# Patient Record
Sex: Female | Born: 1935 | Race: Black or African American | Hispanic: No | Smoking: Never smoker
Health system: Southern US, Community
[De-identification: ages and names within clinical notes are randomized; demographics above are authoritative.]

## PROBLEM LIST (undated history)

## (undated) DIAGNOSIS — I2510 Atherosclerotic heart disease of native coronary artery without angina pectoris: Secondary | ICD-10-CM

## (undated) DIAGNOSIS — M1990 Unspecified osteoarthritis, unspecified site: Secondary | ICD-10-CM

## (undated) DIAGNOSIS — N189 Chronic kidney disease, unspecified: Secondary | ICD-10-CM

## (undated) DIAGNOSIS — E7800 Pure hypercholesterolemia, unspecified: Secondary | ICD-10-CM

## (undated) DIAGNOSIS — I4891 Unspecified atrial fibrillation: Secondary | ICD-10-CM

## (undated) DIAGNOSIS — I509 Heart failure, unspecified: Secondary | ICD-10-CM

## (undated) DIAGNOSIS — L8990 Pressure ulcer of unspecified site, unspecified stage: Secondary | ICD-10-CM

## (undated) DIAGNOSIS — I10 Essential (primary) hypertension: Secondary | ICD-10-CM

## (undated) DIAGNOSIS — M609 Myositis, unspecified: Secondary | ICD-10-CM

## (undated) DIAGNOSIS — N289 Disorder of kidney and ureter, unspecified: Secondary | ICD-10-CM

## (undated) DIAGNOSIS — M869 Osteomyelitis, unspecified: Secondary | ICD-10-CM

## (undated) HISTORY — PX: HERNIA REPAIR: SHX51

---

## 2013-11-11 DIAGNOSIS — I2510 Atherosclerotic heart disease of native coronary artery without angina pectoris: Secondary | ICD-10-CM

## 2013-11-11 HISTORY — DX: Atherosclerotic heart disease of native coronary artery without angina pectoris: I25.10

## 2014-08-27 ENCOUNTER — Inpatient Hospital Stay (HOSPITAL_COMMUNITY)
Admission: EM | Admit: 2014-08-27 | Discharge: 2014-08-29 | DRG: 193 | Disposition: A | Discharge status: Home / Self Care | Payer: Medicare Other | Attending: Internal Medicine | Admitting: Internal Medicine

## 2014-08-27 ENCOUNTER — Encounter (HOSPITAL_COMMUNITY): Payer: Self-pay | Admitting: *Deleted

## 2014-08-27 ENCOUNTER — Emergency Department (HOSPITAL_COMMUNITY): Payer: Medicare Other

## 2014-08-27 DIAGNOSIS — A481 Legionnaires' disease: Secondary | ICD-10-CM | POA: Diagnosis present

## 2014-08-27 DIAGNOSIS — N184 Chronic kidney disease, stage 4 (severe): Secondary | ICD-10-CM | POA: Diagnosis present

## 2014-08-27 DIAGNOSIS — E860 Dehydration: Secondary | ICD-10-CM | POA: Diagnosis present

## 2014-08-27 DIAGNOSIS — I129 Hypertensive chronic kidney disease with stage 1 through stage 4 chronic kidney disease, or unspecified chronic kidney disease: Secondary | ICD-10-CM | POA: Diagnosis present

## 2014-08-27 DIAGNOSIS — J189 Pneumonia, unspecified organism: Principal | ICD-10-CM | POA: Diagnosis present

## 2014-08-27 DIAGNOSIS — G9340 Encephalopathy, unspecified: Secondary | ICD-10-CM | POA: Diagnosis present

## 2014-08-27 DIAGNOSIS — I10 Essential (primary) hypertension: Secondary | ICD-10-CM | POA: Diagnosis present

## 2014-08-27 DIAGNOSIS — Z66 Do not resuscitate: Secondary | ICD-10-CM | POA: Diagnosis present

## 2014-08-27 DIAGNOSIS — E780 Pure hypercholesterolemia: Secondary | ICD-10-CM | POA: Diagnosis present

## 2014-08-27 DIAGNOSIS — I509 Heart failure, unspecified: Secondary | ICD-10-CM | POA: Diagnosis present

## 2014-08-27 DIAGNOSIS — J9601 Acute respiratory failure with hypoxia: Secondary | ICD-10-CM | POA: Diagnosis present

## 2014-08-27 HISTORY — DX: Heart failure, unspecified: I50.9

## 2014-08-27 HISTORY — DX: Essential (primary) hypertension: I10

## 2014-08-27 HISTORY — DX: Disorder of kidney and ureter, unspecified: N28.9

## 2014-08-27 HISTORY — DX: Pure hypercholesterolemia, unspecified: E78.00

## 2014-08-27 LAB — CBC
HCT: 39.9 % (ref 36.0–46.0)
HEMOGLOBIN: 13.3 g/dL (ref 12.0–15.0)
MCH: 29.6 pg (ref 26.0–34.0)
MCHC: 33.3 g/dL (ref 30.0–36.0)
MCV: 88.7 fL (ref 78.0–100.0)
Platelets: 276 10*3/uL (ref 150–400)
RBC: 4.5 MIL/uL (ref 3.87–5.11)
RDW: 13.1 % (ref 11.5–15.5)
WBC: 8.3 10*3/uL (ref 4.0–10.5)

## 2014-08-27 LAB — CBG MONITORING, ED: GLUCOSE-CAPILLARY: 137 mg/dL — AB (ref 70–99)

## 2014-08-27 LAB — COMPREHENSIVE METABOLIC PANEL
ALT: 11 U/L (ref 0–35)
ANION GAP: 15 (ref 5–15)
AST: 23 U/L (ref 0–37)
Albumin: 3.2 g/dL — ABNORMAL LOW (ref 3.5–5.2)
Alkaline Phosphatase: 72 U/L (ref 39–117)
BUN: 11 mg/dL (ref 6–23)
CO2: 24 mEq/L (ref 19–32)
Calcium: 10.3 mg/dL (ref 8.4–10.5)
Chloride: 97 mEq/L (ref 96–112)
Creatinine, Ser: 1.51 mg/dL — ABNORMAL HIGH (ref 0.50–1.10)
GFR calc non Af Amer: 28 mL/min — ABNORMAL LOW (ref >90)
GFR, EST AFRICAN AMERICAN: 32 mL/min — AB (ref >90)
GLUCOSE: 151 mg/dL — AB (ref 70–99)
Potassium: 4 mEq/L (ref 3.7–5.3)
SODIUM: 136 meq/L — AB (ref 137–147)
TOTAL PROTEIN: 8.2 g/dL (ref 6.0–8.3)
Total Bilirubin: 0.9 mg/dL (ref 0.3–1.2)

## 2014-08-27 LAB — URINALYSIS, ROUTINE W REFLEX MICROSCOPIC
Bilirubin Urine: NEGATIVE
Glucose, UA: NEGATIVE mg/dL
Ketones, ur: NEGATIVE mg/dL
Leukocytes, UA: NEGATIVE
NITRITE: NEGATIVE
Protein, ur: 100 mg/dL — AB
SPECIFIC GRAVITY, URINE: 1.025 (ref 1.005–1.030)
UROBILINOGEN UA: 1 mg/dL (ref 0.0–1.0)
pH: 6 (ref 5.0–8.0)

## 2014-08-27 LAB — URINE MICROSCOPIC-ADD ON

## 2014-08-27 MED ORDER — AZITHROMYCIN 500 MG IV SOLR
500.0000 mg | Freq: Once | INTRAVENOUS | Status: AC
  Administered 2014-08-28: 500 mg via INTRAVENOUS
  Filled 2014-08-27: qty 500

## 2014-08-27 MED ORDER — ACETAMINOPHEN 325 MG PO TABS
ORAL_TABLET | ORAL | Status: AC
  Filled 2014-08-27: qty 2

## 2014-08-27 MED ORDER — DEXTROSE 5 % IV SOLN
500.0000 mg | INTRAVENOUS | Status: DC
  Administered 2014-08-28: 500 mg via INTRAVENOUS
  Filled 2014-08-27 (×2): qty 500

## 2014-08-27 MED ORDER — DEXTROSE 5 % IV SOLN
1.0000 g | INTRAVENOUS | Status: DC
  Administered 2014-08-28: 1 g via INTRAVENOUS
  Filled 2014-08-27 (×2): qty 10

## 2014-08-27 MED ORDER — SODIUM CHLORIDE 0.9 % IV SOLN
1000.0000 mL | INTRAVENOUS | Status: DC
Start: 2014-08-27 — End: 2014-08-28
  Administered 2014-08-27: 1000 mL via INTRAVENOUS

## 2014-08-27 MED ORDER — SODIUM CHLORIDE 0.9 % IV SOLN
1000.0000 mL | Freq: Once | INTRAVENOUS | Status: AC
  Administered 2014-08-27: 1000 mL via INTRAVENOUS

## 2014-08-27 MED ORDER — DEXTROSE 5 % IV SOLN
1.0000 g | Freq: Once | INTRAVENOUS | Status: AC
  Administered 2014-08-27: 1 g via INTRAVENOUS
  Filled 2014-08-27: qty 10

## 2014-08-27 MED ORDER — HEPARIN SODIUM (PORCINE) 5000 UNIT/ML IJ SOLN
5000.0000 [IU] | Freq: Three times a day (TID) | INTRAMUSCULAR | Status: DC
  Administered 2014-08-28 – 2014-08-29 (×6): 5000 [IU] via SUBCUTANEOUS
  Filled 2014-08-27 (×6): qty 1

## 2014-08-27 MED ORDER — ACETAMINOPHEN 325 MG PO TABS
650.0000 mg | ORAL_TABLET | Freq: Once | ORAL | Status: AC
  Administered 2014-08-27: 650 mg via ORAL

## 2014-08-27 NOTE — ED Notes (Signed)
MD at bedside. 

## 2014-08-27 NOTE — H&P (Signed)
Hospitalist Admission History and Physical  Patient name: Melanie Byrd Medical record number: [id number] Date of birth: Mar 26, 1916 Age: 80 y.o. Gender: female  Primary Care Provider: No primary care provider on file.  Chief Complaint: encephalopathy, CAP  History of Present Illness:This is a 80 y.o. year old female with significant past medical history of HTN, CHF presenting with encephalopathy, CAP. Family reports pt with 3-4 days of cough, subjective fever, malaise. No nausea, emesis. No diarrhea. Non smoker. Family reports that pt had a similar episode feb 2014 that was assd w/ PNA. Pt has had pneumovax per family.  Presented to ER, T 101.7, HR 80s-100s, resp 20s, BP 140s-150s, Satting >92% on RA. WBC 8.3, hgb 13.3, Cr 1.51. Head CT WNL. UA non indicative of infection. CXR shows LLL PNA. Pt started on Rocephin and azithro. Blood cultures x1 obtained.   Assessment and Plan: Melanie Byrd is a 80 y.o. year old female presenting with Encephalopathy, CAP   Active Problems:   CAP (community acquired pneumonia)   1- CAP  -rocephin and azithro  -blood cultures -urine strep and legionella -supplemental O2  2- Encephalopathy  -likely subacute on chronic in setting of above.  -head CT WNL  -follow   3- CHF -clinically euvolemic  -consider 2D ECHO if resp status fails to improve   4- CKD  -cr 1.5 -stage IV-V  -unclear if this is baseline -gently hydrate  -follow   FEN/GI: heart healthy diet  Prophylaxis: sub q heparin  Disposition: pending further evaluation  Code Status:DNR    Patient Active Problem List   Diagnosis Date Noted  . CAP (community acquired pneumonia) 08/27/2014   Past Medical History: Past Medical History  Diagnosis Date  . Hypertension   . Hypercholesterolemia   . Renal disorder   . CHF (congestive heart failure)     Past Surgical History: Past Surgical History  Procedure Laterality Date  . Hernia repair      about 40years ago     Social  History: History   Social History  . Marital Status: Widowed    Spouse Name: N/A    Number of Children: N/A  . Years of Education: N/A   Social History Main Topics  . Smoking status: Never Smoker   . Smokeless tobacco: None  . Alcohol Use: No  . Drug Use: None  . Sexual Activity: None   Other Topics Concern  . None   Social History Narrative  . None    Family History: No family history on file.  Allergies: No Known Allergies  Current Facility-Administered Medications  Medication Dose Route Frequency Provider Last Rate Last Dose  . 0.9 %  sodium chloride infusion  1,000 mL Intravenous Continuous Linwood Dibbles, MD      . azithromycin (ZITHROMAX) 500 mg in dextrose 5 % 250 mL IVPB  500 mg Intravenous Once Linwood Dibbles, MD      . azithromycin (ZITHROMAX) 500 mg in dextrose 5 % 250 mL IVPB  500 mg Intravenous Q24H Doree Albee, MD      . cefTRIAXone (ROCEPHIN) 1 g in dextrose 5 % 50 mL IVPB  1 g Intravenous Once Linwood Dibbles, MD 100 mL/hr at 08/27/14 2244 1 g at 08/27/14 2244  . cefTRIAXone (ROCEPHIN) 1 g in dextrose 5 % 50 mL IVPB  1 g Intravenous Q24H Doree Albee, MD      . heparin injection 5,000 Units  5,000 Units Subcutaneous 3 times per day Doree Albee, MD  Current Outpatient Prescriptions  Medication Sig Dispense Refill  . amitriptyline (ELAVIL) 10 MG tablet Take 10 mg by mouth at bedtime.    Marland Kitchen. amLODipine (NORVASC) 5 MG tablet Take 5 mg by mouth every morning.    Marland Kitchen. apixaban (ELIQUIS) 2.5 MG TABS tablet Take 2.5 mg by mouth 2 (two) times daily.    Marland Kitchen. atorvastatin (LIPITOR) 20 MG tablet Take 20 mg by mouth every evening.    . benzonatate (TESSALON) 100 MG capsule Take 100 mg by mouth 2 (two) times daily as needed for cough.    . cholecalciferol (VITAMIN D) 1000 UNITS tablet Take 1,000 Units by mouth every morning.    . folic acid ([employer]S FOLIC ACID) 800 MCG tablet Take 800 mcg by mouth every morning.    . gabapentin (NEURONTIN) 100 MG capsule Take 100 mg by mouth 3 (three)  times daily.    . metoprolol succinate (TOPROL-XL) 50 MG 24 hr tablet Take 50 mg by mouth at bedtime. Take with or immediately following a meal.    . traMADol (ULTRAM) 50 MG tablet Take 50 mg by mouth at bedtime.    . triamterene-hydrochlorothiazide (MAXZIDE-25) 37.5-25 MG per tablet Take 0.5 tablets by mouth 3 (three) times a week.     Review Of Systems: 12 point ROS negative except as noted above in HPI.  Physical Exam: Filed Vitals:   08/27/14 2234  BP: 143/69  Pulse: 84  Temp: 101.7 F (38.7 C)  Resp: 24    General: cooperative HEENT: PERRLA and extra ocular movement intact Heart: S1, S2 normal, no murmur, rub or gallop, regular rate and rhythm Lungs: unlabored breathing and  Bibasilar rales  Abdomen: abdomen is soft without significant tenderness, masses, organomegaly or guarding Extremities: extremities normal, atraumatic, no cyanosis or edema Skin:no rashes Neurology: normal without focal findings  Labs and Imaging: Lab Results  Component Value Date/Time   NA 136* 08/27/2014 09:30 PM   K 4.0 08/27/2014 09:30 PM   CL 97 08/27/2014 09:30 PM   CO2 24 08/27/2014 09:30 PM   BUN 11 08/27/2014 09:30 PM   CREATININE 1.51* 08/27/2014 09:30 PM   GLUCOSE 151* 08/27/2014 09:30 PM   Lab Results  Component Value Date   WBC 8.3 08/27/2014   HGB 13.3 08/27/2014   HCT 39.9 08/27/2014   MCV 88.7 08/27/2014   PLT 276 08/27/2014   Urinalysis    Component Value Date/Time   COLORURINE YELLOW 08/27/2014 2115   APPEARANCEUR CLEAR 08/27/2014 2115   LABSPEC 1.025 08/27/2014 2115   PHURINE 6.0 08/27/2014 2115   GLUCOSEU NEGATIVE 08/27/2014 2115   HGBUR SMALL* 08/27/2014 2115   BILIRUBINUR NEGATIVE 08/27/2014 2115   KETONESUR NEGATIVE 08/27/2014 2115   PROTEINUR 100* 08/27/2014 2115   UROBILINOGEN 1.0 08/27/2014 2115   NITRITE NEGATIVE 08/27/2014 2115   LEUKOCYTESUR NEGATIVE 08/27/2014 2115       Dg Chest 2 View  08/27/2014   CLINICAL DATA:  Cough for 1 week  EXAM: CHEST   2 VIEW  COMPARISON:  None.  FINDINGS: Cardiac shadow is within normal limits. The lungs are well aerated bilaterally however left basilar infiltrate with associated effusion is seen. No acute bony abnormality is noted.  IMPRESSION: Left lower lobe infiltrate.   Electronically Signed   By: Alcide CleverMark  Lukens M.D.   On: 08/27/2014 22:13   Ct Head Wo Contrast  08/27/2014   CLINICAL DATA:  Altered mental status, dehydration  EXAM: CT HEAD WITHOUT CONTRAST  TECHNIQUE: Contiguous axial images were obtained from the  base of the skull through the vertex without intravenous contrast.  COMPARISON:  None.  FINDINGS: Bony calvarium is intact. No gross soft tissue abnormality is noted. Diffuse atrophic changes are noted. Mild chronic white matter ischemic change is seen. No findings to suggest acute hemorrhage, acute infarction or space-occupying mass lesion are noted.  IMPRESSION: Chronic atrophic and ischemic changes without acute abnormality.   Electronically Signed   By: Alcide CleverMark  Lukens M.D.   On: 08/27/2014 22:12           Doree AlbeeSteven Derriona Branscom MD  Pager: [phone]

## 2014-08-27 NOTE — ED Notes (Signed)
Pt here from home.  Family called EMS for altered mental status x 1 day.  Pt arrives alert and able to answer questions about her name and birthday as well as who the president is, not able to give me the year.  Pt does not have any facial droop or appear to have any focal weakness.

## 2014-08-27 NOTE — ED Notes (Signed)
[facility] arrived in ED.  Pt was then given rocephin IV when it was ordered and this ran for about 5min at which time the second set of BC was ordered and abt was paused [facility], Admitting MD aware, lab notified

## 2014-08-27 NOTE — ED Notes (Signed)
Placed pt on 2L [place] as spo2 was fluctuating between 90 and 93%

## 2014-08-27 NOTE — ED Provider Notes (Signed)
CSN: [id number]     Arrival date & time 08/27/14  2104 History  This chart was scribed for Linwood DibblesJon Tyreonna Czaplicki, MD by Milly JakobJohn Lee Graves, ED Scribe. The patient was seen in room APA01/APA01. Patient's care was started at 9:23 PM.     Chief Complaint  Patient presents with  . Altered Mental Status   The history is provided by the patient and a relative. No language interpreter was used.   HPI Comments: Melanie Byrd is a 80 y.o. female who was brought by EMS to the Emergency Department complaining of persistent cough onset 1 week ago and acutely worsened two days ago. Her relatives report that she is experiencing an altered mental status, and this is why they called EMS. Her relatives report that she has additionally been dehydrated and had difficulty walking today.  Past Medical History  Diagnosis Date  . Hypertension   . Hypercholesterolemia   . Renal disorder   . CHF (congestive heart failure)    Past Surgical History  Procedure Laterality Date  . Hernia repair      about 40years ago    History reviewed. No pertinent family history. History  Substance Use Topics  . Smoking status: Never Smoker   . Smokeless tobacco: Not on file  . Alcohol Use: No   OB History    No data available     Review of Systems  Respiratory: Positive for cough.    A complete 10 system review of systems was obtained and all systems are negative except as noted in the HPI and PMH.   Allergies  Review of patient's allergies indicates no known allergies.  Home Medications   Prior to Admission medications   Medication Sig Start Date End Date Taking? Authorizing Provider  amitriptyline (ELAVIL) 10 MG tablet Take 10 mg by mouth at bedtime.   Yes Historical Provider, MD  apixaban (ELIQUIS) 2.5 MG TABS tablet Take 2.5 mg by mouth 2 (two) times daily.   Yes Historical Provider, MD  atorvastatin (LIPITOR) 20 MG tablet Take 20 mg by mouth every evening.   Yes Historical Provider, MD  benzonatate (TESSALON) 100 MG  capsule Take 100 mg by mouth 2 (two) times daily as needed for cough.   Yes Historical Provider, MD  cholecalciferol (VITAMIN D) 1000 UNITS tablet Take 1,000 Units by mouth every morning.   Yes Historical Provider, MD  folic acid ([employer]S FOLIC ACID) 800 MCG tablet Take 800 mcg by mouth every morning.   Yes Historical Provider, MD  gabapentin (NEURONTIN) 100 MG capsule Take 100 mg by mouth 3 (three) times daily.   Yes Historical Provider, MD  metoprolol succinate (TOPROL-XL) 50 MG 24 hr tablet Take 50 mg by mouth at bedtime. Take with or immediately following a meal.   Yes Historical Provider, MD  traMADol (ULTRAM) 50 MG tablet Take 50 mg by mouth at bedtime.   Yes Historical Provider, MD  azithromycin (ZITHROMAX) 500 MG tablet Take 1 tablet (500 mg total) by mouth daily. 08/29/14   Erick BlinksJehanzeb Memon, MD  cefUROXime (CEFTIN) 500 MG tablet Take 1 tablet (500 mg total) by mouth 2 (two) times daily with a meal. 08/29/14   Erick BlinksJehanzeb Memon, MD  guaiFENesin (MUCINEX) 600 MG 12 hr tablet Take 1 tablet (600 mg total) by mouth 2 (two) times daily. 08/29/14   Erick BlinksJehanzeb Memon, MD   Triage Vitals: BP 150/85 mmHg  Pulse 109  Temp(Src) 99.6 F (37.6 C) (Oral)  Resp 20  Ht 5\' 5"  (1.651 m)  Wt  134 lb (60.782 kg)  BMI 22.30 kg/m2  SpO2 92% Physical Exam  Constitutional: No distress.  frail  HENT:  Head: Normocephalic and atraumatic.  Right Ear: External ear normal.  Left Ear: External ear normal.  Mucus membranes dry  Eyes: Conjunctivae are normal. Right eye exhibits no discharge. Left eye exhibits no discharge. No scleral icterus.  Neck: Neck supple. No tracheal deviation present.  Cardiovascular: Normal rate, regular rhythm and intact distal pulses.   Pulmonary/Chest: Effort normal and breath sounds normal. No stridor. No respiratory distress. She has no wheezes. She has no rales.  Frequent coughing  Abdominal: Soft. Bowel sounds are normal. She exhibits no distension. There is no tenderness. There is no  rebound and no guarding.  Musculoskeletal: She exhibits no edema or tenderness.  Neurological: She is alert. No cranial nerve deficit (no facial droop, extraocular movements intact, no slurred speech) or sensory deficit. She exhibits normal muscle tone. She displays no seizure activity. Coordination normal.  Follows commands, moves all extremities.   Skin: Skin is warm and dry. No rash noted.  Psychiatric: She has a normal mood and affect.  Nursing note and vitals reviewed.   ED Course  Procedures (including critical care time) DIAGNOSTIC STUDIES: Oxygen Saturation is 92% on room air, adequate by my interpretation.    COORDINATION OF CARE: 9:27 PM-Discussed treatment plan which includes CXR and lab work with pt at bedside and pt agreed to plan.   Labs Review Labs Reviewed  COMPREHENSIVE METABOLIC PANEL - Abnormal; Notable for the following:    Sodium 136 (*)    Glucose, Bld 151 (*)    Creatinine, Ser 1.51 (*)    Albumin 3.2 (*)    GFR calc non Af Amer 28 (*)    GFR calc Af Amer 32 (*)    All other components within normal limits  URINALYSIS, ROUTINE W REFLEX MICROSCOPIC - Abnormal; Notable for the following:    Hgb urine dipstick SMALL (*)    Protein, ur 100 (*)    All other components within normal limits  COMPREHENSIVE METABOLIC PANEL - Abnormal; Notable for the following:    Glucose, Bld 141 (*)    Creatinine, Ser 1.39 (*)    Albumin 2.5 (*)    GFR calc non Af Amer 31 (*)    GFR calc Af Amer 36 (*)    All other components within normal limits  CBC WITH DIFFERENTIAL - Abnormal; Notable for the following:    Hemoglobin 11.8 (*)    HCT 35.0 (*)    All other components within normal limits  COMPREHENSIVE METABOLIC PANEL - Abnormal; Notable for the following:    Potassium 3.3 (*)    Glucose, Bld 110 (*)    Creatinine, Ser 1.22 (*)    Albumin 2.5 (*)    GFR calc non Af Amer 36 (*)    GFR calc Af Amer 42 (*)    All other components within normal limits  CBC WITH  DIFFERENTIAL - Abnormal; Notable for the following:    RBC 3.83 (*)    Hemoglobin 11.6 (*)    HCT 33.7 (*)    All other components within normal limits  CBG MONITORING, ED - Abnormal; Notable for the following:    Glucose-Capillary 137 (*)    All other components within normal limits  CULTURE, BLOOD (ROUTINE X 2)  CULTURE, BLOOD (ROUTINE X 2)  CULTURE, EXPECTORATED SPUTUM-ASSESSMENT  GRAM STAIN  CBC  URINE MICROSCOPIC-ADD ON  HIV ANTIBODY (ROUTINE TESTING)  LEGIONELLA ANTIGEN, URINE  STREP PNEUMONIAE URINARY ANTIGEN    Imaging Review No results found.   EKG Interpretation   Date/Time:  Tuesday August 27 2014 21:34:52 EST Ventricular Rate:  121 PR Interval:  166 QRS Duration: 96 QT Interval:  316 QTC Calculation: 448 R Axis:   -12 Text Interpretation:  Sinus tachycardia with irregular rate No previous  tracing Confirmed by Tierria Watson  MD-J, Susano Cleckler ([id number]) on 08/27/2014 9:44:17 PM     Medications  0.9 %  sodium chloride infusion (0 mLs Intravenous Stopped 08/27/14 2302)  acetaminophen (TYLENOL) tablet 650 mg (650 mg Oral Given 08/27/14 2240)  cefTRIAXone (ROCEPHIN) 1 g in dextrose 5 % 50 mL IVPB (1 g Intravenous New Bag/Given 08/27/14 2244)  azithromycin (ZITHROMAX) 500 mg in dextrose 5 % 250 mL IVPB (500 mg Intravenous Given 08/28/14 0355)  amitriptyline (ELAVIL) tablet 10 mg (10 mg Oral Given 08/28/14 2215)    MDM   Final diagnoses:  CAP (community acquired pneumonia)   CXR is consistent with a PNA.  Inpatient treatment recommended by PORT score.  Iv abx given in the ED.   Findings discussed with family.  Admitted for further treatment in stable condition.  I personally performed the services described in this documentation, which was scribed in my presence.  The recorded information has been reviewed and is accurate.     Linwood DibblesJon Geza Beranek, MD 08/30/14 [phone]

## 2014-08-28 DIAGNOSIS — I10 Essential (primary) hypertension: Secondary | ICD-10-CM | POA: Diagnosis present

## 2014-08-28 DIAGNOSIS — J9601 Acute respiratory failure with hypoxia: Secondary | ICD-10-CM | POA: Diagnosis present

## 2014-08-28 DIAGNOSIS — A481 Legionnaires' disease: Secondary | ICD-10-CM | POA: Diagnosis present

## 2014-08-28 DIAGNOSIS — I509 Heart failure, unspecified: Secondary | ICD-10-CM | POA: Diagnosis present

## 2014-08-28 DIAGNOSIS — G9340 Encephalopathy, unspecified: Secondary | ICD-10-CM

## 2014-08-28 DIAGNOSIS — E860 Dehydration: Secondary | ICD-10-CM | POA: Diagnosis present

## 2014-08-28 DIAGNOSIS — E780 Pure hypercholesterolemia: Secondary | ICD-10-CM | POA: Diagnosis present

## 2014-08-28 DIAGNOSIS — Z66 Do not resuscitate: Secondary | ICD-10-CM | POA: Diagnosis present

## 2014-08-28 DIAGNOSIS — N184 Chronic kidney disease, stage 4 (severe): Secondary | ICD-10-CM | POA: Diagnosis present

## 2014-08-28 DIAGNOSIS — I129 Hypertensive chronic kidney disease with stage 1 through stage 4 chronic kidney disease, or unspecified chronic kidney disease: Secondary | ICD-10-CM | POA: Diagnosis present

## 2014-08-28 DIAGNOSIS — J189 Pneumonia, unspecified organism: Secondary | ICD-10-CM | POA: Diagnosis present

## 2014-08-28 LAB — COMPREHENSIVE METABOLIC PANEL
ALBUMIN: 2.5 g/dL — AB (ref 3.5–5.2)
ALT: 8 U/L (ref 0–35)
AST: 19 U/L (ref 0–37)
Alkaline Phosphatase: 58 U/L (ref 39–117)
Anion gap: 10 (ref 5–15)
BUN: 10 mg/dL (ref 6–23)
CALCIUM: 9.3 mg/dL (ref 8.4–10.5)
CO2: 25 mEq/L (ref 19–32)
CREATININE: 1.39 mg/dL — AB (ref 0.50–1.10)
Chloride: 104 mEq/L (ref 96–112)
GFR calc non Af Amer: 31 mL/min — ABNORMAL LOW (ref >90)
GFR, EST AFRICAN AMERICAN: 36 mL/min — AB (ref >90)
GLUCOSE: 141 mg/dL — AB (ref 70–99)
POTASSIUM: 3.7 meq/L (ref 3.7–5.3)
Sodium: 139 mEq/L (ref 137–147)
TOTAL PROTEIN: 6.6 g/dL (ref 6.0–8.3)
Total Bilirubin: 0.6 mg/dL (ref 0.3–1.2)

## 2014-08-28 LAB — CBC WITH DIFFERENTIAL/PLATELET
Basophils Absolute: 0 10*3/uL (ref 0.0–0.1)
Basophils Relative: 0 % (ref 0–1)
Eosinophils Absolute: 0.1 10*3/uL (ref 0.0–0.7)
Eosinophils Relative: 1 % (ref 0–5)
HEMATOCRIT: 35 % — AB (ref 36.0–46.0)
HEMOGLOBIN: 11.8 g/dL — AB (ref 12.0–15.0)
LYMPHS PCT: 21 % (ref 12–46)
Lymphs Abs: 1.9 10*3/uL (ref 0.7–4.0)
MCH: 30 pg (ref 26.0–34.0)
MCHC: 33.7 g/dL (ref 30.0–36.0)
MCV: 89.1 fL (ref 78.0–100.0)
MONO ABS: 0.9 10*3/uL (ref 0.1–1.0)
MONOS PCT: 11 % (ref 3–12)
Neutro Abs: 5.9 10*3/uL (ref 1.7–7.7)
Neutrophils Relative %: 67 % (ref 43–77)
Platelets: 267 10*3/uL (ref 150–400)
RBC: 3.93 MIL/uL (ref 3.87–5.11)
RDW: 13.1 % (ref 11.5–15.5)
WBC: 8.8 10*3/uL (ref 4.0–10.5)

## 2014-08-28 LAB — STREP PNEUMONIAE URINARY ANTIGEN: Strep Pneumo Urinary Antigen: NEGATIVE

## 2014-08-28 LAB — HIV ANTIBODY (ROUTINE TESTING W REFLEX): HIV: NONREACTIVE

## 2014-08-28 MED ORDER — BENZONATATE 100 MG PO CAPS
100.0000 mg | ORAL_CAPSULE | Freq: Two times a day (BID) | ORAL | Status: DC | PRN
  Administered 2014-08-28 (×2): 100 mg via ORAL
  Filled 2014-08-28 (×2): qty 1

## 2014-08-28 MED ORDER — AMITRIPTYLINE HCL 10 MG PO TABS
10.0000 mg | ORAL_TABLET | Freq: Once | ORAL | Status: AC
  Administered 2014-08-28: 10 mg via ORAL
  Filled 2014-08-28: qty 1

## 2014-08-28 MED ORDER — GUAIFENESIN ER 600 MG PO TB12
1200.0000 mg | ORAL_TABLET | Freq: Two times a day (BID) | ORAL | Status: DC
  Administered 2014-08-28 – 2014-08-29 (×2): 1200 mg via ORAL
  Filled 2014-08-28 (×2): qty 2

## 2014-08-28 MED ORDER — SODIUM CHLORIDE 0.9 % IV SOLN
INTRAVENOUS | Status: DC
  Administered 2014-08-28 – 2014-08-29 (×3): via INTRAVENOUS

## 2014-08-28 NOTE — Progress Notes (Signed)
ANTIBIOTIC CONSULT NOTE - INITIAL  Pharmacy Consult for Renal Dosing of ABX Rocephin and Zithromax currently  Indication: rule out pneumonia  No Known Allergies  Patient Measurements: Height: 5\' 3"  (160 cm) Weight: 121 lb 3.2 oz (54.976 kg) IBW/kg (Calculated) : 52.4  Vital Signs: Temp: 98.7 F (37.1 C) (11/18 0500) Temp Source: Oral (11/18 0500) BP: 114/57 mmHg (11/18 0500) Pulse Rate: 70 (11/18 0500) Intake/Output from previous day: 11/17 0701 - 11/18 0700 In: -  Out: 400 [Urine:400] Intake/Output from this shift:    Labs:  Recent Labs  08/27/14 2130 08/28/14 0633  WBC 8.3 8.8  HGB 13.3 11.8*  PLT 276 267  CREATININE 1.51* 1.39*   Estimated Creatinine Clearance: 19.6 mL/min (by C-G formula based on Cr of 1.39). No results for input(s): VANCOTROUGH, VANCOPEAK, VANCORANDOM, GENTTROUGH, GENTPEAK, GENTRANDOM, TOBRATROUGH, TOBRAPEAK, TOBRARND, AMIKACINPEAK, AMIKACINTROU, AMIKACIN in the last 72 hours.   Microbiology: Recent Results (from the past 720 hour(s))  Culture, blood (routine x 2) Call MD if unable to obtain prior to antibiotics being given     Status: None (Preliminary result)   Collection Time: 08/27/14  9:30 PM  Result Value Ref Range Status   Specimen Description LEFT ANTECUBITAL  Final   Special Requests BOTTLES DRAWN AEROBIC AND ANAEROBIC 8CC EACH  Final   Culture NO GROWTH 1 DAY  Final   Report Status PENDING  Incomplete  Culture, blood (routine x 2) Call MD if unable to obtain prior to antibiotics being given     Status: None (Preliminary result)   Collection Time: 08/27/14 11:05 PM  Result Value Ref Range Status   Specimen Description BLOOD LEFT ARM  Final   Special Requests BOTTLES DRAWN AEROBIC ONLY 4CC  Final   Culture NO GROWTH 1 DAY  Final   Report Status PENDING  Incomplete   Medical History: Past Medical History  Diagnosis Date  . Hypertension   . Hypercholesterolemia   . Renal disorder   . CHF (congestive heart failure)     Medications:  Prescriptions prior to admission  Medication Sig Dispense Refill Last Dose  . amitriptyline (ELAVIL) 10 MG tablet Take 10 mg by mouth at bedtime.   08/26/2014 at Unknown time  . amLODipine (NORVASC) 5 MG tablet Take 5 mg by mouth every morning.   08/27/2014 at Unknown time  . apixaban (ELIQUIS) 2.5 MG TABS tablet Take 2.5 mg by mouth 2 (two) times daily.   08/26/2014 at Unknown time  . atorvastatin (LIPITOR) 20 MG tablet Take 20 mg by mouth every evening.   08/26/2014 at Unknown time  . benzonatate (TESSALON) 100 MG capsule Take 100 mg by mouth 2 (two) times daily as needed for cough.   08/27/2014 at Unknown time  . cholecalciferol (VITAMIN D) 1000 UNITS tablet Take 1,000 Units by mouth every morning.   08/27/2014  . folic acid ([employer]S FOLIC ACID) 800 MCG tablet Take 800 mcg by mouth every morning.   08/27/2014 at Unknown time  . gabapentin (NEURONTIN) 100 MG capsule Take 100 mg by mouth 3 (three) times daily.   08/27/2014 at Unknown time  . metoprolol succinate (TOPROL-XL) 50 MG 24 hr tablet Take 50 mg by mouth at bedtime. Take with or immediately following a meal.   08/26/2014 at 2200  . traMADol (ULTRAM) 50 MG tablet Take 50 mg by mouth at bedtime.   08/26/2014 at Unknown time  . triamterene-hydrochlorothiazide (MAXZIDE-25) 37.5-25 MG per tablet Take 0.5 tablets by mouth 3 (three) times a week.  Past Week at Unknown time   Anti-infectives    Start     Dose/Rate Route Frequency Ordered Stop   08/27/14 2300  cefTRIAXone (ROCEPHIN) 1 g in dextrose 5 % 50 mL IVPB     1 g100 mL/hr over 30 Minutes Intravenous Every 24 hours 08/27/14 2246 09/03/14 2259   08/27/14 2300  azithromycin (ZITHROMAX) 500 mg in dextrose 5 % 250 mL IVPB     500 mg250 mL/hr over 60 Minutes Intravenous Every 24 hours 08/27/14 2246 09/03/14 2259   08/27/14 2245  cefTRIAXone (ROCEPHIN) 1 g in dextrose 5 % 50 mL IVPB     1 g100 mL/hr over 30 Minutes Intravenous  Once 08/27/14 2236 08/27/14 2314   08/27/14 2245   azithromycin (ZITHROMAX) 500 mg in dextrose 5 % 250 mL IVPB     500 mg250 mL/hr over 60 Minutes Intravenous  Once 08/27/14 2236 08/28/14 0455     Assessment: 80yo female presenting with encephalopathy and CAP.  Family reports 3-4 days of coughing, fever, and malaise.  Rocephin and Zithromax do not need renal adjustment.    Goal of Therapy:  Eradicate infection.  Plan:  Continue Rocephin and Zithromax per MD orders Switch Zithromax to PO when improved / appropriate Deescalate antibiotics as appropriate.  F/U blood cultures   Valrie HartHall, Laneshia Pina A 08/28/2014,10:41 AM

## 2014-08-28 NOTE — Progress Notes (Signed)
[facility] PROGRESS NOTE  Melanie CruzMaggie Byrd ZOX:[id number] DOB: 01/19/1916 DOA: 08/27/2014 PCP: No primary care provider on file.  Assessment/Plan: 1. Acute respiratory failure with hypoxia, likely related to pneumonia. We'll try to wean down oxygen as tolerated. 2. Community acquired pneumonia. Continue empiric antibiotics. Follow blood cultures. Start flutter valve. Continue pulmonary hygiene 3. Acute encephalopathy. Apparently related to dehydration and infection. Appears to be improving. Next I dehydration. Improved with IV fluids.  Code Status: DO NOT RESUSCITATE Family Communication: Discharged/discussed with daughters at the bedside Disposition Plan: Discharge home once improved   Consultants:    Procedures:    Antibiotics:  Azithromycin 11/17  Rocephin 11/17  HPI/Subjective: Patient is feeling better, no new complaints  Objective: Filed Vitals:   08/28/14 1445  BP: 136/56  Pulse: 78  Temp: 97.9 F (36.6 C)  Resp: 18    Intake/Output Summary (Last 24 hours) at 08/28/14 2022 Last data filed at 08/28/14 1855  Gross per 24 hour  Intake    240 ml  Output    400 ml  Net   -160 ml   Filed Weights   08/27/14 2138 08/27/14 2352  Weight: 60.782 kg (134 lb) 54.976 kg (121 lb 3.2 oz)    Exam:   General: NAD  Cardiovascular: S1, s2 RRR  Respiratory: rhonchi bilaterally  Abdomen: soft, nt, nd, bs+  Musculoskeletal: no edema b/l   Data Reviewed: Basic Metabolic Panel:  Recent Labs Lab 08/27/14 2130 08/28/14 0633  NA 136* 139  K 4.0 3.7  CL 97 104  CO2 24 25  GLUCOSE 151* 141*  BUN 11 10  CREATININE 1.51* 1.39*  CALCIUM 10.3 9.3   Liver Function Tests:  Recent Labs Lab 08/27/14 2130 08/28/14 0633  AST 23 19  ALT 11 8  ALKPHOS 72 58  BILITOT 0.9 0.6  PROT 8.2 6.6  ALBUMIN 3.2* 2.5*   No results for input(s): LIPASE, AMYLASE in the last 168 hours. No results for input(s): AMMONIA in the last 168 hours. CBC:  Recent Labs Lab  08/27/14 2130 08/28/14 0633  WBC 8.3 8.8  NEUTROABS  --  5.9  HGB 13.3 11.8*  HCT 39.9 35.0*  MCV 88.7 89.1  PLT 276 267   Cardiac Enzymes: No results for input(s): CKTOTAL, CKMB, CKMBINDEX, TROPONINI in the last 168 hours. BNP (last 3 results) No results for input(s): PROBNP in the last 8760 hours. CBG:  Recent Labs Lab 08/27/14 2133  GLUCAP 137*    Recent Results (from the past 240 hour(s))  Culture, blood (routine x 2) Call MD if unable to obtain prior to antibiotics being given     Status: None (Preliminary result)   Collection Time: 08/27/14  9:30 PM  Result Value Ref Range Status   Specimen Description LEFT ANTECUBITAL  Final   Special Requests BOTTLES DRAWN AEROBIC AND ANAEROBIC 8CC EACH  Final   Culture NO GROWTH 1 DAY  Final   Report Status PENDING  Incomplete  Culture, blood (routine x 2) Call MD if unable to obtain prior to antibiotics being given     Status: None (Preliminary result)   Collection Time: 08/27/14 11:05 PM  Result Value Ref Range Status   Specimen Description BLOOD LEFT ARM  Final   Special Requests BOTTLES DRAWN AEROBIC ONLY 4CC  Final   Culture NO GROWTH 1 DAY  Final   Report Status PENDING  Incomplete     Studies: Dg Chest 2 View  08/27/2014   CLINICAL DATA:  Cough for 1 week  EXAM: CHEST  2 VIEW  COMPARISON:  None.  FINDINGS: Cardiac shadow is within normal limits. The lungs are well aerated bilaterally however left basilar infiltrate with associated effusion is seen. No acute bony abnormality is noted.  IMPRESSION: Left lower lobe infiltrate.   Electronically Signed   By: Alcide CleverMark  Lukens M.D.   On: 08/27/2014 22:13   Ct Head Wo Contrast  08/27/2014   CLINICAL DATA:  Altered mental status, dehydration  EXAM: CT HEAD WITHOUT CONTRAST  TECHNIQUE: Contiguous axial images were obtained from the base of the skull through the vertex without intravenous contrast.  COMPARISON:  None.  FINDINGS: Bony calvarium is intact. No gross soft tissue abnormality  is noted. Diffuse atrophic changes are noted. Mild chronic white matter ischemic change is seen. No findings to suggest acute hemorrhage, acute infarction or space-occupying mass lesion are noted.  IMPRESSION: Chronic atrophic and ischemic changes without acute abnormality.   Electronically Signed   By: Alcide CleverMark  Lukens M.D.   On: 08/27/2014 22:12    Scheduled Meds: . azithromycin  500 mg Intravenous Q24H  . cefTRIAXone (ROCEPHIN)  IV  1 g Intravenous Q24H  . heparin  5,000 Units Subcutaneous 3 times per day   Continuous Infusions: . sodium chloride 75 mL/hr at 08/28/14 1822    Active Problems:   CAP (community acquired pneumonia)    Time spent: 35mins    [facility] PLL[facility]EMON,Won Kreuzer  [facility] Pager [phone]. If 7PM-7AM, please contact night-coverage at [email or web address], password [email or web address] 08/28/2014, 8:22 PM  LOS: 1 day

## 2014-08-28 NOTE — Progress Notes (Signed)
Patient's chart wasn't allowing nurse to scan medications. Normal saline 1L was hung IV at 125 mL/hr at 0530.

## 2014-08-29 DIAGNOSIS — E860 Dehydration: Secondary | ICD-10-CM | POA: Insufficient documentation

## 2014-08-29 LAB — COMPREHENSIVE METABOLIC PANEL
ALBUMIN: 2.5 g/dL — AB (ref 3.5–5.2)
ALK PHOS: 66 U/L (ref 39–117)
ALT: 12 U/L (ref 0–35)
ANION GAP: 13 (ref 5–15)
AST: 29 U/L (ref 0–37)
BUN: 6 mg/dL (ref 6–23)
CHLORIDE: 108 meq/L (ref 96–112)
CO2: 23 mEq/L (ref 19–32)
Calcium: 9.4 mg/dL (ref 8.4–10.5)
Creatinine, Ser: 1.22 mg/dL — ABNORMAL HIGH (ref 0.50–1.10)
GFR calc Af Amer: 42 mL/min — ABNORMAL LOW (ref >90)
GFR, EST NON AFRICAN AMERICAN: 36 mL/min — AB (ref >90)
GLUCOSE: 110 mg/dL — AB (ref 70–99)
POTASSIUM: 3.3 meq/L — AB (ref 3.7–5.3)
Sodium: 144 mEq/L (ref 137–147)
Total Bilirubin: 0.5 mg/dL (ref 0.3–1.2)
Total Protein: 6.5 g/dL (ref 6.0–8.3)

## 2014-08-29 LAB — CBC WITH DIFFERENTIAL/PLATELET
BASOS ABS: 0 10*3/uL (ref 0.0–0.1)
BASOS PCT: 0 % (ref 0–1)
EOS PCT: 2 % (ref 0–5)
Eosinophils Absolute: 0.1 10*3/uL (ref 0.0–0.7)
HEMATOCRIT: 33.7 % — AB (ref 36.0–46.0)
Hemoglobin: 11.6 g/dL — ABNORMAL LOW (ref 12.0–15.0)
Lymphocytes Relative: 26 % (ref 12–46)
Lymphs Abs: 2.3 10*3/uL (ref 0.7–4.0)
MCH: 30.3 pg (ref 26.0–34.0)
MCHC: 34.4 g/dL (ref 30.0–36.0)
MCV: 88 fL (ref 78.0–100.0)
MONO ABS: 0.7 10*3/uL (ref 0.1–1.0)
Monocytes Relative: 8 % (ref 3–12)
Neutro Abs: 5.7 10*3/uL (ref 1.7–7.7)
Neutrophils Relative %: 64 % (ref 43–77)
PLATELETS: 281 10*3/uL (ref 150–400)
RBC: 3.83 MIL/uL — ABNORMAL LOW (ref 3.87–5.11)
RDW: 13.1 % (ref 11.5–15.5)
WBC: 8.8 10*3/uL (ref 4.0–10.5)

## 2014-08-29 MED ORDER — GUAIFENESIN ER 600 MG PO TB12
600.0000 mg | ORAL_TABLET | Freq: Two times a day (BID) | ORAL | Status: DC
Start: 2014-08-29 — End: 2015-04-18

## 2014-08-29 MED ORDER — AZITHROMYCIN 500 MG PO TABS: 500.0000 mg | ORAL_TABLET | Freq: Every day | ORAL | Status: DC

## 2014-08-29 MED ORDER — POTASSIUM CHLORIDE CRYS ER 20 MEQ PO TBCR: 40.0000 meq | EXTENDED_RELEASE_TABLET | Freq: Once | ORAL | Status: DC

## 2014-08-29 MED ORDER — CEFUROXIME AXETIL 500 MG PO TABS: 500.0000 mg | ORAL_TABLET | Freq: Two times a day (BID) | ORAL | Status: DC

## 2014-08-29 NOTE — Discharge Summary (Signed)
Physician Discharge Summary  Melanie Byrd IHK:[id number] DOB: 1916-07-25 DOA: 08/27/2014  PCP: No primary care provider on file.  Admit date: 08/27/2014 Discharge date: 08/29/2014  Time spent: 40 minutes  Recommendations for Outpatient Follow-up:  1. Patient will be discharged with home health services 2. Follow up with primary care physician in 1-2 weeks  Discharge Diagnoses:  Active Problems:   CAP (community acquired pneumonia), likely related to legionella   Acute respiratory failure with hypoxia   Acute encephalopathy Essential hypertension Dehydration  Discharge Condition: improved  Diet recommendation: low salt  Filed Weights   08/27/14 2138 08/27/14 2352  Weight: 60.782 kg (134 lb) 54.976 kg (121 lb 3.2 oz)    History of present illness:  This 80 year old female was admitted to the hospital with encephalopathy, subjective fever and malaise. She was found to have chest x-ray findings consistent with pneumonia and was admitted to the hospital for further treatments.  Hospital Course:  The patient was started on intravenous antibiotics and fluids. She did have an element of dehydration which is contributing to encephalopathy. With antibiotics and fluids, her overall condition has improved. She initially did require supplemental oxygen, but has been weaned down to room air and is now breathing comfortably on room air. She's not had any further fevers and does not have any leukocytosis. Clinically she appears to be improved and is breathing comfortably. Her mental status appears to be approaching baseline. Interestingly, her urinary antigen for Legionella was positive. She was placed on oral antibiotics. She was seen by physical therapy and recommended home health physical therapy. The patient is clinically improved and is ready for discharge home.  Procedures:    Consultations:    Discharge Exam: Filed Vitals:   08/29/14 1507  BP: 140/72  Pulse: 96  Temp: 98.6 F (37  C)  Resp: 20    General: NAD Cardiovascular: S1 S2 RRR Respiratory: rhonchi at bases  Discharge Instructions You were cared for by a hospitalist during your hospital stay. If you have any questions about your discharge medications or the care you received while you were in the hospital after you are discharged, you can call the unit and asked to speak with the hospitalist on call if the hospitalist that took care of you is not available. Once you are discharged, your primary care physician will handle any further medical issues. Please note that NO REFILLS for any discharge medications will be authorized once you are discharged, as it is imperative that you return to your primary care physician (or establish a relationship with a primary care physician if you do not have one) for your aftercare needs so that they can reassess your need for medications and monitor your lab values.  Discharge Instructions    Call MD for:  difficulty breathing, headache or visual disturbances    Complete by:  As directed      Call MD for:  temperature >100.4    Complete by:  As directed      Diet - low sodium heart healthy    Complete by:  As directed      Face-to-face encounter (required for Medicare/Medicaid patients)    Complete by:  As directed   I Masashi Snowdon certify that this patient is under my care and that I, or a nurse practitioner or physician's assistant working with me, had a face-to-face encounter that meets the physician face-to-face encounter requirements with this patient on 08/29/2014. The encounter with the patient was in whole, or in part for the  following medical condition(s) which is the primary reason for home health care (List medical condition): frail 80 y/o lady admitted with pneumonia, needs home health RN, PT and aide  The encounter with the patient was in whole, or in part, for the following medical condition, which is the primary reason for home health care:  pneumonia  I certify  that, based on my findings, the following services are medically necessary home health services:   NursingPhysical therapy    Reason for Medically Necessary Home Health Services:  Skilled Nursing- Skilled Assessment/Observation  My clinical findings support the need for the above services:  Unable to leave home safely without assistance and/or assistive device  Further, I certify that my clinical findings support that this patient is homebound due to:  Unable to leave home safely without assistance     Home Health    Complete by:  As directed   To provide the following care/treatments:   [facility] Aide       Increase activity slowly    Complete by:  As directed           Current Discharge Medication List    START taking these medications   Details  azithromycin (ZITHROMAX) 500 MG tablet Take 1 tablet (500 mg total) by mouth daily. Qty: 4 tablet, Refills: 0    cefUROXime (CEFTIN) 500 MG tablet Take 1 tablet (500 mg total) by mouth 2 (two) times daily with a meal. Qty: 8 tablet, Refills: 0    guaiFENesin (MUCINEX) 600 MG 12 hr tablet Take 1 tablet (600 mg total) by mouth 2 (two) times daily. Qty: 20 tablet, Refills: 0      CONTINUE these medications which have NOT CHANGED   Details  amitriptyline (ELAVIL) 10 MG tablet Take 10 mg by mouth at bedtime.    apixaban (ELIQUIS) 2.5 MG TABS tablet Take 2.5 mg by mouth 2 (two) times daily.    atorvastatin (LIPITOR) 20 MG tablet Take 20 mg by mouth every evening.    benzonatate (TESSALON) 100 MG capsule Take 100 mg by mouth 2 (two) times daily as needed for cough.    cholecalciferol (VITAMIN D) 1000 UNITS tablet Take 1,000 Units by mouth every morning.    folic acid ([employer] FOLIC ACID) 800 MCG tablet Take 800 mcg by mouth every morning.    gabapentin (NEURONTIN) 100 MG capsule Take 100 mg by mouth 3 (three) times daily.    metoprolol succinate (TOPROL-XL) 50 MG 24 hr tablet Take 50 mg by mouth at bedtime. Take with or immediately  following a meal.    traMADol (ULTRAM) 50 MG tablet Take 50 mg by mouth at bedtime.      STOP taking these medications     amLODipine (NORVASC) 5 MG tablet      triamterene-hydrochlorothiazide (MAXZIDE-25) 37.5-25 MG per tablet        No Known Allergies Follow-up Information    [facility]   Contact information:   [address] [place] [id number] [phone]       Follow up with follow up with your primary care doctor in 1-2 weeks.       The results of significant diagnostics from this hospitalization (including imaging, microbiology, ancillary and laboratory) are listed below for reference.    Significant Diagnostic Studies: Dg Chest 2 View  08/27/2014   CLINICAL DATA:  Cough for 1 week  EXAM: CHEST  2 VIEW  COMPARISON:  None.  FINDINGS:  Cardiac shadow is within normal limits. The lungs are well aerated bilaterally however left basilar infiltrate with associated effusion is seen. No acute bony abnormality is noted.  IMPRESSION: Left lower lobe infiltrate.   Electronically Signed   By: Alcide CleverMark  Lukens M.D.   On: 08/27/2014 22:13   Ct Head Wo Contrast  08/27/2014   CLINICAL DATA:  Altered mental status, dehydration  EXAM: CT HEAD WITHOUT CONTRAST  TECHNIQUE: Contiguous axial images were obtained from the base of the skull through the vertex without intravenous contrast.  COMPARISON:  None.  FINDINGS: Bony calvarium is intact. No gross soft tissue abnormality is noted. Diffuse atrophic changes are noted. Mild chronic white matter ischemic change is seen. No findings to suggest acute hemorrhage, acute infarction or space-occupying mass lesion are noted.  IMPRESSION: Chronic atrophic and ischemic changes without acute abnormality.   Electronically Signed   By: Alcide CleverMark  Lukens M.D.   On: 08/27/2014 22:12    Microbiology: Recent Results (from the past 240 hour(s))  Culture, blood (routine x 2) Call MD if unable to obtain prior to  antibiotics being given     Status: None (Preliminary result)   Collection Time: 08/27/14  9:30 PM  Result Value Ref Range Status   Specimen Description BLOOD LEFT ANTECUBITAL  Final   Special Requests BOTTLES DRAWN AEROBIC AND ANAEROBIC 8CC EACH  Final   Culture NO GROWTH 2 DAYS  Final   Report Status PENDING  Incomplete  Culture, blood (routine x 2) Call MD if unable to obtain prior to antibiotics being given     Status: None (Preliminary result)   Collection Time: 08/27/14 11:05 PM  Result Value Ref Range Status   Specimen Description BLOOD LEFT ARM  Final   Special Requests BOTTLES DRAWN AEROBIC ONLY 4CC  Final   Culture NO GROWTH 2 DAYS  Final   Report Status PENDING  Incomplete     Labs: Basic Metabolic Panel:  Recent Labs Lab 08/27/14 2130 08/28/14 0633 08/29/14 0629  NA 136* 139 144  K 4.0 3.7 3.3*  CL 97 104 108  CO2 24 25 23   GLUCOSE 151* 141* 110*  BUN 11 10 6   CREATININE 1.51* 1.39* 1.22*  CALCIUM 10.3 9.3 9.4   Liver Function Tests:  Recent Labs Lab 08/27/14 2130 08/28/14 0633 08/29/14 0629  AST 23 19 29   ALT 11 8 12   ALKPHOS 72 58 66  BILITOT 0.9 0.6 0.5  PROT 8.2 6.6 6.5  ALBUMIN 3.2* 2.5* 2.5*   No results for input(s): LIPASE, AMYLASE in the last 168 hours. No results for input(s): AMMONIA in the last 168 hours. CBC:  Recent Labs Lab 08/27/14 2130 08/28/14 0633 08/29/14 0629  WBC 8.3 8.8 8.8  NEUTROABS  --  5.9 5.7  HGB 13.3 11.8* 11.6*  HCT 39.9 35.0* 33.7*  MCV 88.7 89.1 88.0  PLT 276 267 281   Cardiac Enzymes: No results for input(s): CKTOTAL, CKMB, CKMBINDEX, TROPONINI in the last 168 hours. BNP: BNP (last 3 results) No results for input(s): PROBNP in the last 8760 hours. CBG:  Recent Labs Lab 08/27/14 2133  GLUCAP 137*       Signed:  Tenita Cue  [facility] 08/29/2014, 4:19 PM

## 2014-08-29 NOTE — Plan of Care (Signed)
Critical: Urine specimen from 11/18 - positive for Legionella. Dr. Informed.

## 2014-08-29 NOTE — Evaluation (Signed)
Physical Therapy Evaluation Patient Details Name: Melanie Byrd MRN: [id number] DOB: 03/27/1916 Today's Date: 08/29/2014   History of Present Illness  Pt is a 80 year old female who was admitted with acute respiratory failure secondary to pneumonia.  She also had dehydration with encephalopathy.   Her son and daughter live with her and give her care.  Pt is able to walk short distances in the home with a walker and 2 person assist.  She is now seen for evaluation of strength and mobility.  Clinical Impression   Pt is found to be mildly deconditioned from her baseline, her baseline being that of frailty and very limited mobility.  Daugher was present for evaluation.  Pt's overall strength is 3-/5 and she needs mod to max assist to transfer out of bed.  She was able to ambulate 10' with a walker and mod assist, unstable gait.  She will probably need to be mostly w/c dependent initially upon discharge and we will ask for HHPT to begin at that time.  Pt tolerated PT well and she was very cooperative with all of my requests.    Follow Up Recommendations Home health PT    Equipment Recommendations  None recommended by PT    Recommendations for Other Services   none    Precautions / Restrictions Precautions Precautions: Fall Restrictions Weight Bearing Restrictions: No      Mobility  Bed Mobility Overal bed mobility: Needs Assistance Bed Mobility: Sidelying to Sit   Sidelying to sit: Min assist          Transfers Overall transfer level: Needs assistance Equipment used: Rolling walker (2 wheeled) Transfers: Sit to/from Stand Sit to Stand: Max assist         General transfer comment: due to weakness in LEs, pt needs increased assist to shift weight anteriorly and stand  Ambulation/Gait Ambulation/Gait assistance: Mod assist Ambulation Distance (Feet): 10 Feet Assistive device: Rolling walker (2 wheeled) Gait Pattern/deviations: Decreased step length - right;Decreased step  length - left;Trunk flexed;Narrow base of support   Gait velocity interpretation: Below normal speed for age/gender General Gait Details: gait is very labored and pt needs manual and verbal cues to fully extend knees in stance...gait is very unstable at this time  Careers information officertairs            Wheelchair Mobility    Modified Rankin (Stroke Patients Only)       Balance Overall balance assessment: Needs assistance Sitting-balance support: Feet supported;Bilateral upper extremity supported Sitting balance-Leahy Scale: Good     Standing balance support: Bilateral upper extremity supported Standing balance-Leahy Scale: Poor Standing balance comment: balance limited due to weakness                             Pertinent Vitals/Pain Pain Assessment: No/denies pain    Home Living Family/patient expects to be discharged to:: Private residence Living Arrangements: Children Available Help at Discharge: Family;Available 24 hours/day Type of Home: House Home Access: Stairs to enter Entrance Stairs-Rails: Right Entrance Stairs-Number of Steps: 6 Home Layout: One level Home Equipment: Walker - 2 wheels;Bedside commode;Wheelchair - manual Additional Comments: pt is brought into the home via w/c and family lifts her up the steps, one at a time.  I gave them information as to how to get a ramp.    Prior Function Level of Independence: Needs assistance   Gait / Transfers Assistance Needed: able to transfer with min assist and ambulate with  a walker and 2 person assist for short distances  ADL's / Homemaking Assistance Needed: assist with personal grooming, pt can dress herself in necessary        Hand Dominance   Dominant Hand: Right    Extremity/Trunk Assessment   Upper Extremity Assessment: Generalized weakness           Lower Extremity Assessment: Generalized weakness      Cervical / Trunk Assessment: Kyphotic  Communication   Communication: No difficulties   Cognition Arousal/Alertness: Awake/alert Behavior During Therapy: WFL for tasks assessed/performed Overall Cognitive Status: Within Functional Limits for tasks assessed                      General Comments      Exercises General Exercises - Lower Extremity Ankle Circles/Pumps: AROM;Both;10 reps;Supine Short Arc Quad: AROM;Both;10 reps;Supine Heel Slides: AAROM;Both;10 reps;Supine Hip ABduction/ADduction: AAROM;Both;10 reps;Supine      Assessment/Plan    PT Assessment Patient needs continued PT services  PT Diagnosis Difficulty walking;Generalized weakness   PT Problem List Decreased strength;Decreased activity tolerance;Decreased mobility  PT Treatment Interventions Gait training;Functional mobility training;Therapeutic exercise   PT Goals (Current goals can be found in the Care Plan section) Acute Rehab PT Goals Patient Stated Goal: none stated PT Goal Formulation: With patient/family Time For Goal Achievement: 09/12/14 Potential to Achieve Goals: Fair    Frequency Min 3X/week   Barriers to discharge Inaccessible home environment needs a ramp for home access    Co-evaluation               End of Session Equipment Utilized During Treatment: Gait belt Activity Tolerance: Patient tolerated treatment well Patient left: in chair;with call bell/phone within reach;with family/visitor present           Time: 4098-11911423-1504 PT Time Calculation (min) (ACUTE ONLY): 41 min   Charges:   PT Evaluation $Initial PT Evaluation Tier I: 1 Procedure PT Treatments $Therapeutic Exercise: 8-22 mins   PT G Codes:          Myrlene BrokerBrown, Dennis Killilea L 08/29/2014, 3:10 PM

## 2014-08-29 NOTE — Care Management Note (Addendum)
    Page 1 of 2   08/29/2014     4:34:15 PM CARE MANAGEMENT NOTE 08/29/2014  [facility]  [facility]   Account Number:  [id number]  Date Initiated:  08/29/2014  Documentation initiated by:  Sharrie RothmanBLACKWELL,Tomiko Schoon C  Subjective/Objective Assessment:   Pt admitted from home with pneumonia. Pt lives with her daughter and will return home at discharge. Pt has a cane, walker, and BSC. Family would like HH PT and aide [facility]     Action/Plan:   CM will fax orders and face to face once available. HH services to start within 48 hours of discharge. No DME needs noted.   Anticipated DC Date:  08/29/2014   Anticipated DC Plan:  HOME W HOME HEALTH SERVICES      DC Planning Services  CM consult      [facility]  [facility]   Choice offered to / List presented to:  C-4 Adult Children        HH arranged  HH-2 PT  HH-4 NURSE'S AIDE  HH-1 RN      [facility]  [facility]   Status of service:  Completed, signed off Medicare Important Message given?   (If response is "NO", the following Medicare IM given date fields will be blank) Date Medicare IM given:   Medicare IM given by:   Date Additional Medicare IM given:   Additional Medicare IM given by:    Discharge Disposition:  HOME W HOME HEALTH SERVICES  Per UR Regulation:    If discussed at [place] Length of Stay Meetings, dates discussed:    Comments:  08/29/14 1545 Arlyss Queenammy Brynnleigh Mcelwee, RN BSN CM Pt [facility] today [facility], PT, and aide. CM called [facility] and will fax orders once written. Hh services to start within 48 hours of discharge. No DME needs noted as pt has cane, walker, and BSC.  Pt and pts nurse aware of discharge arrangements.  08/29/14 1330 Arlyss Queenammy Jamesetta Greenhalgh, RN BSN CM

## 2014-08-30 LAB — LEGIONELLA ANTIGEN, URINE

## 2014-09-01 LAB — CULTURE, BLOOD (ROUTINE X 2)
Culture: NO GROWTH
Culture: NO GROWTH

## 2015-03-08 ENCOUNTER — Emergency Department (HOSPITAL_COMMUNITY): Payer: Medicare Other

## 2015-03-08 ENCOUNTER — Emergency Department (HOSPITAL_COMMUNITY)
Admission: EM | Admit: 2015-03-08 | Discharge: 2015-03-08 | Disposition: A | Discharge status: Home / Self Care | Payer: Medicare Other | Attending: Emergency Medicine | Admitting: Emergency Medicine

## 2015-03-08 ENCOUNTER — Encounter (HOSPITAL_COMMUNITY): Payer: Self-pay | Admitting: Emergency Medicine

## 2015-03-08 DIAGNOSIS — I10 Essential (primary) hypertension: Secondary | ICD-10-CM | POA: Diagnosis not present

## 2015-03-08 DIAGNOSIS — Z7902 Long term (current) use of antithrombotics/antiplatelets: Secondary | ICD-10-CM | POA: Insufficient documentation

## 2015-03-08 DIAGNOSIS — E780 Pure hypercholesterolemia: Secondary | ICD-10-CM | POA: Insufficient documentation

## 2015-03-08 DIAGNOSIS — Z8742 Personal history of other diseases of the female genital tract: Secondary | ICD-10-CM | POA: Insufficient documentation

## 2015-03-08 DIAGNOSIS — E860 Dehydration: Secondary | ICD-10-CM | POA: Insufficient documentation

## 2015-03-08 DIAGNOSIS — Z8739 Personal history of other diseases of the musculoskeletal system and connective tissue: Secondary | ICD-10-CM | POA: Diagnosis not present

## 2015-03-08 DIAGNOSIS — Z792 Long term (current) use of antibiotics: Secondary | ICD-10-CM | POA: Diagnosis not present

## 2015-03-08 DIAGNOSIS — I509 Heart failure, unspecified: Secondary | ICD-10-CM | POA: Diagnosis not present

## 2015-03-08 DIAGNOSIS — Z79899 Other long term (current) drug therapy: Secondary | ICD-10-CM | POA: Insufficient documentation

## 2015-03-08 DIAGNOSIS — R4182 Altered mental status, unspecified: Secondary | ICD-10-CM | POA: Diagnosis present

## 2015-03-08 HISTORY — DX: Unspecified osteoarthritis, unspecified site: M19.90

## 2015-03-08 LAB — CBC WITH DIFFERENTIAL/PLATELET
BASOS ABS: 0 10*3/uL (ref 0.0–0.1)
Basophils Relative: 0 % (ref 0–1)
EOS PCT: 0 % (ref 0–5)
Eosinophils Absolute: 0 10*3/uL (ref 0.0–0.7)
HEMATOCRIT: 40.9 % (ref 36.0–46.0)
Hemoglobin: 13.7 g/dL (ref 12.0–15.0)
LYMPHS PCT: 13 % (ref 12–46)
Lymphs Abs: 1.6 10*3/uL (ref 0.7–4.0)
MCH: 29.4 pg (ref 26.0–34.0)
MCHC: 33.5 g/dL (ref 30.0–36.0)
MCV: 87.8 fL (ref 78.0–100.0)
MONOS PCT: 7 % (ref 3–12)
Monocytes Absolute: 0.9 10*3/uL (ref 0.1–1.0)
Neutro Abs: 9.7 10*3/uL — ABNORMAL HIGH (ref 1.7–7.7)
Neutrophils Relative %: 80 % — ABNORMAL HIGH (ref 43–77)
Platelets: 416 10*3/uL — ABNORMAL HIGH (ref 150–400)
RBC: 4.66 MIL/uL (ref 3.87–5.11)
RDW: 13.5 % (ref 11.5–15.5)
WBC: 12.2 10*3/uL — ABNORMAL HIGH (ref 4.0–10.5)

## 2015-03-08 LAB — COMPREHENSIVE METABOLIC PANEL
ALK PHOS: 71 U/L (ref 38–126)
ALT: 32 U/L (ref 14–54)
AST: 44 U/L — ABNORMAL HIGH (ref 15–41)
Albumin: 3.2 g/dL — ABNORMAL LOW (ref 3.5–5.0)
Anion gap: 11 (ref 5–15)
BILIRUBIN TOTAL: 0.6 mg/dL (ref 0.3–1.2)
BUN: 22 mg/dL — ABNORMAL HIGH (ref 6–20)
CO2: 26 mmol/L (ref 22–32)
CREATININE: 1.6 mg/dL — AB (ref 0.44–1.00)
Calcium: 10.5 mg/dL — ABNORMAL HIGH (ref 8.9–10.3)
Chloride: 98 mmol/L — ABNORMAL LOW (ref 101–111)
GFR calc Af Amer: 30 mL/min — ABNORMAL LOW (ref >60)
GFR calc non Af Amer: 26 mL/min — ABNORMAL LOW (ref >60)
GLUCOSE: 165 mg/dL — AB (ref 65–99)
Potassium: 3.6 mmol/L (ref 3.5–5.1)
SODIUM: 135 mmol/L (ref 135–145)
TOTAL PROTEIN: 7.6 g/dL (ref 6.5–8.1)

## 2015-03-08 LAB — URINALYSIS, ROUTINE W REFLEX MICROSCOPIC
Bilirubin Urine: NEGATIVE
GLUCOSE, UA: NEGATIVE mg/dL
KETONES UR: NEGATIVE mg/dL
NITRITE: NEGATIVE
PH: 7 (ref 5.0–8.0)
Protein, ur: NEGATIVE mg/dL
Specific Gravity, Urine: <1.005 — ABNORMAL LOW (ref 1.005–1.030)
UROBILINOGEN UA: 0.2 mg/dL (ref 0.0–1.0)

## 2015-03-08 LAB — URINE MICROSCOPIC-ADD ON

## 2015-03-08 NOTE — ED Provider Notes (Signed)
CSN: [id number]     Arrival date & time 03/08/15  1139 History   First MD Initiated Contact with Patient 03/08/15 1329     Chief Complaint  Patient presents with  . Altered Mental Status     (Consider location/radiation/quality/duration/timing/severity/associated sxs/prior Treatment) Patient is a 81 y.o. female presenting with altered mental status. The history is provided by a relative (the pt is not thinking as clearly according to family).  Altered Mental Status Presenting symptoms: confusion   Severity:  Mild Most recent episode:  Yesterday Episode history:  Multiple Timing:  Constant Progression:  Unchanged Chronicity:  New Context: dementia   Associated symptoms: no abdominal pain, no hallucinations, no headaches, no rash and no seizures     Past Medical History  Diagnosis Date  . Hypertension   . Hypercholesterolemia   . Renal disorder   . CHF (congestive heart failure)   . Arthritis    Past Surgical History  Procedure Laterality Date  . Hernia repair      about 40years ago    History reviewed. No pertinent family history. History  Substance Use Topics  . Smoking status: Never Smoker   . Smokeless tobacco: Never Used  . Alcohol Use: No   OB History    Gravida Para Term Preterm AB TAB SAB Ectopic Multiple Living   Review of Systems  Constitutional: Negative for appetite change and fatigue.  HENT: Negative for congestion, ear discharge and sinus pressure.   Eyes: Negative for discharge.  Respiratory: Negative for cough.   Cardiovascular: Negative for chest pain.  Gastrointestinal: Negative for abdominal pain and diarrhea.  Genitourinary: Negative for frequency and hematuria.  Musculoskeletal: Negative for back pain.  Skin: Negative for rash.  Neurological: Negative for seizures and headaches.  Psychiatric/Behavioral: Positive for confusion. Negative for hallucinations.      Allergies  Review of patient's allergies indicates no  known allergies.  Home Medications   Prior to Admission medications   Medication Sig Start Date End Date Taking? Authorizing Provider  amitriptyline (ELAVIL) 10 MG tablet Take 10 mg by mouth at bedtime.   Yes Historical Provider, MD  amLODipine (NORVASC) 5 MG tablet Take 5 mg by mouth daily.   Yes Historical Provider, MD  apixaban (ELIQUIS) 2.5 MG TABS tablet Take 2.5 mg by mouth 2 (two) times daily.   Yes Historical Provider, MD  atorvastatin (LIPITOR) 20 MG tablet Take 20 mg by mouth every evening.   Yes Historical Provider, MD  cephALEXin (KEFLEX) 250 MG capsule Take 250 mg by mouth 3 (three) times daily. Started 03/04/15 for 7 days   Yes Historical Provider, MD  cholecalciferol (VITAMIN D) 1000 UNITS tablet Take 1,000 Units by mouth every morning.   Yes Historical Provider, MD  docusate sodium (COLACE) 100 MG capsule Take 100 mg by mouth 2 (two) times daily.   Yes Historical Provider, MD  folic acid ([employer] FOLIC ACID) 800 MCG tablet Take 800 mcg by mouth every morning.   Yes Historical Provider, MD  gabapentin (NEURONTIN) 100 MG capsule Take 100 mg by mouth 3 (three) times daily.   Yes Historical Provider, MD  metoprolol succinate (TOPROL-XL) 50 MG 24 hr tablet Take 50 mg by mouth at bedtime. Take with or immediately following a meal.   Yes Historical Provider, MD  traMADol (ULTRAM) 50 MG tablet Take 50 mg by mouth at bedtime.   Yes Historical Provider, MD  triamterene-hydrochlorothiazide [facility]) 37.5-25  MG per tablet Take 0.5 tablets by mouth daily.   Yes Historical Provider, MD  azithromycin (ZITHROMAX) 500 MG tablet Take 1 tablet (500 mg total) by mouth daily. Patient not taking: Reported on 03/08/2015 08/29/14   Erick Blinks, MD  cefUROXime (CEFTIN) 500 MG tablet Take 1 tablet (500 mg total) by mouth 2 (two) times daily with a meal. Patient not taking: Reported on 03/08/2015 08/29/14   Erick Blinks, MD  guaiFENesin (MUCINEX) 600 MG 12 hr tablet Take 1 tablet (600 mg total) by mouth  2 (two) times daily. Patient not taking: Reported on 03/08/2015 08/29/14   Erick Blinks, MD   BP 138/80 mmHg  Pulse 78  Temp(Src) 97.8 F (36.6 C) (Oral)  Resp 20  Ht  (1.651 m)  Wt 135 lb (61.236 kg)  BMI 22.47 kg/m2  SpO2 98% Physical Exam  Constitutional: She appears well-developed.  HENT:  Head: Normocephalic.  Eyes: Conjunctivae and EOM are normal. No scleral icterus.  Neck: Neck supple. No thyromegaly present.  Cardiovascular: Normal rate and regular rhythm.  Exam reveals no gallop and no friction rub.   No murmur heard. Pulmonary/Chest: No stridor. She has no wheezes. She has no rales. She exhibits no tenderness.  Abdominal: She exhibits no distension. There is no tenderness. There is no rebound.  Musculoskeletal: Normal range of motion. She exhibits no edema.  Lymphadenopathy:    She has no cervical adenopathy.  Neurological: She is alert. She exhibits normal muscle tone. Coordination normal.  Oriented to person and place only  Skin: No rash noted. No erythema.  Psychiatric: She has a normal mood and affect. Her behavior is normal.    ED Course  Procedures (including critical care time) Labs Review Labs Reviewed  CBC WITH DIFFERENTIAL/PLATELET - Abnormal; Notable for the following:    WBC 12.2 (*)    Platelets 416 (*)    Neutrophils Relative % 80 (*)    Neutro Abs 9.7 (*)    All other components within normal limits  COMPREHENSIVE METABOLIC PANEL - Abnormal; Notable for the following:    Chloride 98 (*)    Glucose, Bld 165 (*)    BUN 22 (*)    Creatinine, Ser 1.60 (*)    Calcium 10.5 (*)    Albumin 3.2 (*)    AST 44 (*)    GFR calc non Af Amer 26 (*)    GFR calc Af Amer 30 (*)    All other components within normal limits  URINALYSIS, ROUTINE W REFLEX MICROSCOPIC (NOT AT [facility]) - Abnormal; Notable for the following:    Specific Gravity, Urine <1.005 (*)    Hgb urine dipstick SMALL (*)    Leukocytes, UA TRACE (*)    All other components within normal  limits  URINE MICROSCOPIC-ADD ON - Abnormal; Notable for the following:    Squamous Epithelial / LPF FEW (*)    All other components within normal limits    Imaging Review Dg Chest 1 View  03/08/2015   CLINICAL DATA:  Weakness, recent right knee injection  EXAM: CHEST  1 VIEW  COMPARISON:  08/27/2014  FINDINGS: Chronic interstitial markings. No focal consolidation. Mild left basilar scarring/atelectasis. No pleural effusion or pneumothorax.  The heart is normal in size.  IMPRESSION: No evidence of acute cardiopulmonary disease.   Electronically Signed   By: Charline Bills M.D.   On: 03/08/2015 14:39   Dg Abd 1 View  03/08/2015   CLINICAL DATA:  ALTERED MENTAL STATUS, UNABLE TO URINATE,  CONSTIPATION PER FAMILY. HISTORY OF HTN, CHF, RENAL DISORDER, ARTHRITIS  EXAM: ABDOMEN - 1 VIEW  COMPARISON:  None.  FINDINGS: No bowel distention is seen to suggest obstruction or significant adynamic ileus.  There is mild increased stool in the rectum and left colon.  Soft tissues show vascular calcifications but are otherwise unremarkable.  Bony structures are demineralized. There is sclerosis noted in the left femoral neck likely a bone island.  IMPRESSION: 1. No obstruction or evidence of generalized adynamic ileus. No acute finding. 2. Mild increased stool in the left colon rectum.   Electronically Signed   By: Amie Portlandavid  Ormond M.D.   On: 03/08/2015 17:24   Ct Head Wo Contrast  03/08/2015   CLINICAL DATA:  Generalized weakness  EXAM: CT HEAD WITHOUT CONTRAST  TECHNIQUE: Contiguous axial images were obtained from the base of the skull through the vertex without intravenous contrast.  COMPARISON:  08/27/2014  FINDINGS: Bony calvarium is intact. Generalized atrophic changes are seen. Chronic white matter ischemic changes are noted as well. No findings to suggest acute hemorrhage, acute infarction or space-occupying mass lesion are noted.  IMPRESSION: Chronic atrophic and ischemic changes, stable from the previous  exam. No acute abnormality noted.   Electronically Signed   By: Alcide CleverMark  Lukens M.D.   On: 03/08/2015 14:28     EKG Interpretation   Date/Time:  Saturday Mar 08 2015 12:22:24 EDT Ventricular Rate:  79 PR Interval:  177 QRS Duration: 97 QT Interval:  365 QTC Calculation: 418 R Axis:   -3 Text Interpretation:  Sinus rhythm Probable anterior infarct, old  Confirmed by Audrick Lamoureaux  MD, Armonie Staten [phone]([id number]) on 03/08/2015 6:10:24 PM      MDM   Final diagnoses:  Dehydration    Mild dehydration and confusion.  On keflex for skin infection will cx urine and have pt drink  More fluids and follow up with pcp    Bethann BerkshireJoseph Aaliyah Gavel, MD 03/08/15 1844

## 2015-03-08 NOTE — Discharge Instructions (Signed)
Drink plenty of fluids and follow up with your md next week °

## 2015-03-08 NOTE — ED Notes (Signed)
Per family patient recently got injection in right knee for arthritis on Tuesday and since has had generalized weakness. Per family yesterday at 3PM patient started to "talk to herself and conversations have become difficult." Daughter states that patient is on antibiotics (keflex 250mg  TID) for pressure ulcer on back. Patient also has had constipation in which she has been using over-the-counter medication (Sennacot) with no relief. Daughter reports last BM being 6 days ago.

## 2015-03-08 NOTE — ED Notes (Signed)
Hr 26 is incorrect.

## 2015-03-08 NOTE — ED Notes (Signed)
In and out cath attempted, bladder empty

## 2015-03-08 NOTE — ED Notes (Signed)
Dr Estell HarpinZammit informed of pt not able to void.  Verbal order given to give 500 ml po fluids.  Pt given water with family at bedside to encourage pt to drink fluids.

## 2015-03-08 NOTE — ED Notes (Signed)
Not able to obtain urine with ina and out cath.  Drips of urine noted on cath sheath.  Will attempt again shortly.

## 2015-03-08 NOTE — ED Notes (Signed)
Several attempts made for saline lock without success.  Dr Estell HarpinZammit informed.

## 2015-03-28 ENCOUNTER — Encounter (HOSPITAL_COMMUNITY): Payer: Self-pay | Admitting: *Deleted

## 2015-03-28 ENCOUNTER — Emergency Department (HOSPITAL_COMMUNITY)
Admission: EM | Admit: 2015-03-28 | Discharge: 2015-03-28 | Disposition: A | Discharge status: Home / Self Care | Payer: Medicare Other | Attending: Emergency Medicine | Admitting: Emergency Medicine

## 2015-03-28 DIAGNOSIS — Z79899 Other long term (current) drug therapy: Secondary | ICD-10-CM | POA: Diagnosis not present

## 2015-03-28 DIAGNOSIS — R319 Hematuria, unspecified: Secondary | ICD-10-CM | POA: Diagnosis present

## 2015-03-28 DIAGNOSIS — E780 Pure hypercholesterolemia: Secondary | ICD-10-CM | POA: Insufficient documentation

## 2015-03-28 DIAGNOSIS — Z7902 Long term (current) use of antithrombotics/antiplatelets: Secondary | ICD-10-CM | POA: Diagnosis not present

## 2015-03-28 DIAGNOSIS — I509 Heart failure, unspecified: Secondary | ICD-10-CM | POA: Insufficient documentation

## 2015-03-28 DIAGNOSIS — N390 Urinary tract infection, site not specified: Secondary | ICD-10-CM | POA: Insufficient documentation

## 2015-03-28 DIAGNOSIS — Z8739 Personal history of other diseases of the musculoskeletal system and connective tissue: Secondary | ICD-10-CM | POA: Insufficient documentation

## 2015-03-28 DIAGNOSIS — I10 Essential (primary) hypertension: Secondary | ICD-10-CM | POA: Diagnosis not present

## 2015-03-28 DIAGNOSIS — Z792 Long term (current) use of antibiotics: Secondary | ICD-10-CM | POA: Insufficient documentation

## 2015-03-28 LAB — BASIC METABOLIC PANEL
Anion gap: 8 (ref 5–15)
BUN: 20 mg/dL (ref 6–20)
CHLORIDE: 100 mmol/L — AB (ref 101–111)
CO2: 29 mmol/L (ref 22–32)
CREATININE: 1.56 mg/dL — AB (ref 0.44–1.00)
Calcium: 9.9 mg/dL (ref 8.9–10.3)
GFR calc non Af Amer: 27 mL/min — ABNORMAL LOW (ref >60)
GFR, EST AFRICAN AMERICAN: 31 mL/min — AB (ref >60)
GLUCOSE: 126 mg/dL — AB (ref 65–99)
POTASSIUM: 3.6 mmol/L (ref 3.5–5.1)
Sodium: 137 mmol/L (ref 135–145)

## 2015-03-28 LAB — CBC WITH DIFFERENTIAL/PLATELET
Basophils Absolute: 0 10*3/uL (ref 0.0–0.1)
Basophils Relative: 0 % (ref 0–1)
Eosinophils Absolute: 0.1 10*3/uL (ref 0.0–0.7)
Eosinophils Relative: 2 % (ref 0–5)
HCT: 41.9 % (ref 36.0–46.0)
Hemoglobin: 13.9 g/dL (ref 12.0–15.0)
LYMPHS PCT: 18 % (ref 12–46)
Lymphs Abs: 1.3 10*3/uL (ref 0.7–4.0)
MCH: 29.5 pg (ref 26.0–34.0)
MCHC: 33.2 g/dL (ref 30.0–36.0)
MCV: 89 fL (ref 78.0–100.0)
Monocytes Absolute: 0.5 10*3/uL (ref 0.1–1.0)
Monocytes Relative: 7 % (ref 3–12)
NEUTROS ABS: 5.3 10*3/uL (ref 1.7–7.7)
Neutrophils Relative %: 73 % (ref 43–77)
PLATELETS: 213 10*3/uL (ref 150–400)
RBC: 4.71 MIL/uL (ref 3.87–5.11)
RDW: 14.3 % (ref 11.5–15.5)
WBC: 7.3 10*3/uL (ref 4.0–10.5)

## 2015-03-28 LAB — URINALYSIS, ROUTINE W REFLEX MICROSCOPIC
GLUCOSE, UA: NEGATIVE mg/dL
KETONES UR: NEGATIVE mg/dL
NITRITE: POSITIVE — AB
PROTEIN: 100 mg/dL — AB
Specific Gravity, Urine: 1.015 (ref 1.005–1.030)
Urobilinogen, UA: 1 mg/dL (ref 0.0–1.0)
pH: 6.5 (ref 5.0–8.0)

## 2015-03-28 LAB — URINE MICROSCOPIC-ADD ON

## 2015-03-28 MED ORDER — NITROFURANTOIN MONOHYD MACRO 100 MG PO CAPS: 100.0000 mg | ORAL_CAPSULE | Freq: Two times a day (BID) | ORAL | Status: DC

## 2015-03-28 MED ORDER — NITROFURANTOIN MONOHYD MACRO 100 MG PO CAPS
100.0000 mg | ORAL_CAPSULE | Freq: Once | ORAL | Status: AC
  Administered 2015-03-28: 100 mg via ORAL
  Filled 2015-03-28: qty 1

## 2015-03-28 NOTE — ED Notes (Signed)
Hematuria since this morning. Hx of renal failure.  No c/o dysuria, chills, fevers, n/v/d.

## 2015-03-28 NOTE — ED Notes (Signed)
Patient with no complaints at this time. Respirations even and unlabored. Skin warm/dry. Discharge instructions reviewed with patient at this time. Patient given opportunity to voice concerns/ask questions. Patient discharged at this time and left Emergency Department with steady gait.   

## 2015-03-28 NOTE — Discharge Instructions (Signed)
Urinary Tract Infection A urinary tract infection (UTI) can occur any place along the urinary tract. The tract includes the kidneys, ureters, bladder, and urethra. A type of germ called bacteria often causes a UTI. UTIs are often helped with antibiotic medicine.  HOME CARE   If given, take antibiotics as told by your doctor. Finish them even if you start to feel better.  Drink enough fluids to keep your pee (urine) clear or pale yellow.  Avoid tea, drinks with caffeine, and bubbly (carbonated) drinks.  Pee often. Avoid holding your pee in for a Arvie time.  Pee before and after having sex (intercourse).  Wipe from front to back after you poop (bowel movement) if you are a woman. Use each tissue only once. GET HELP RIGHT AWAY IF:   You have back pain.  You have lower belly (abdominal) pain.  You have chills.  You feel sick to your stomach (nauseous).  You throw up (vomit).  Your burning or discomfort with peeing does not go away.  You have a fever.  Your symptoms are not better in 3 days. MAKE SURE YOU:   Understand these instructions.  Will watch your condition.  Will get help right away if you are not doing well or get worse. Document Released: 03/15/2008 Document Revised: 06/21/2012 Document Reviewed: 04/27/2012 Exit[employer] Patient Information 2015 [employer]. This information is not intended to replace advice given to you by your health care provider. Make sure you discuss any questions you have with your health care provider.  

## 2015-03-28 NOTE — ED Provider Notes (Signed)
CSN: [id number]     Arrival date & time 03/28/15  1431 History   First MD Initiated Contact with Patient 03/28/15 1438     Chief Complaint  Patient presents with  . Hematuria     (Consider location/radiation/quality/duration/timing/severity/associated sxs/prior Treatment) HPI Comments: Patient brought to the emergency department for evaluation after blood in her urine. Patient reportedly wears a depends at home and family noticed that there was pink urine in her depends today. Patient has not been experiencing any fever. There is no vomiting or diarrhea. She denies any pain. She has not noticed any urinary symptoms herself.  Patient is a 81 y.o. female presenting with hematuria.  Hematuria    Past Medical History  Diagnosis Date  . Hypertension   . Hypercholesterolemia   . Renal disorder   . CHF (congestive heart failure)   . Arthritis    Past Surgical History  Procedure Laterality Date  . Hernia repair      about 40years ago    History reviewed. No pertinent family history. History  Substance Use Topics  . Smoking status: Never Smoker   . Smokeless tobacco: Never Used  . Alcohol Use: No   OB History    Gravida Para Term Preterm AB TAB SAB Ectopic Multiple Living   2 2 2       1      Review of Systems  Genitourinary: Positive for hematuria.  All other systems reviewed and are negative.     Allergies  Cephalexin  Home Medications   Prior to Admission medications   Medication Sig Start Date End Date Taking? Authorizing Provider  amitriptyline (ELAVIL) 10 MG tablet Take 10 mg by mouth at bedtime.    Historical Provider, MD  amLODipine (NORVASC) 5 MG tablet Take 5 mg by mouth daily.    Historical Provider, MD  apixaban (ELIQUIS) 2.5 MG TABS tablet Take 2.5 mg by mouth 2 (two) times daily.    Historical Provider, MD  atorvastatin (LIPITOR) 20 MG tablet Take 20 mg by mouth every evening.    Historical Provider, MD  azithromycin (ZITHROMAX) 500 MG tablet Take 1  tablet (500 mg total) by mouth daily. Patient not taking: Reported on 03/08/2015 08/29/14   Erick Blinks, MD  cefUROXime (CEFTIN) 500 MG tablet Take 1 tablet (500 mg total) by mouth 2 (two) times daily with a meal. Patient not taking: Reported on 03/08/2015 08/29/14   Erick Blinks, MD  cephALEXin (KEFLEX) 250 MG capsule Take 250 mg by mouth 3 (three) times daily. Started 03/04/15 for 7 days    Historical Provider, MD  cholecalciferol (VITAMIN D) 1000 UNITS tablet Take 1,000 Units by mouth every morning.    Historical Provider, MD  docusate sodium (COLACE) 100 MG capsule Take 100 mg by mouth 2 (two) times daily.    Historical Provider, MD  folic acid ([facility] FOLIC ACID) 800 MCG tablet Take 800 mcg by mouth every morning.    Historical Provider, MD  gabapentin (NEURONTIN) 100 MG capsule Take 100 mg by mouth 3 (three) times daily.    Historical Provider, MD  guaiFENesin (MUCINEX) 600 MG 12 hr tablet Take 1 tablet (600 mg total) by mouth 2 (two) times daily. Patient not taking: Reported on 03/08/2015 08/29/14   Erick Blinks, MD  metoprolol succinate (TOPROL-XL) 50 MG 24 hr tablet Take 50 mg by mouth at bedtime. Take with or immediately following a meal.    Historical Provider, MD  traMADol (ULTRAM) 50 MG tablet Take 50 mg by mouth  at bedtime.    Historical Provider, MD  triamterene-hydrochlorothiazide (MAXZIDE-25) 37.5-25 MG per tablet Take 0.5 tablets by mouth daily.    Historical Provider, MD   BP 172/83 mmHg  Pulse 72  Temp(Src) 98.2 F (36.8 C) (Oral)  Resp 16  Ht  (1.651 m)  Wt 107 lb (48.535 kg)  BMI 17.81 kg/m2  SpO2 99% Physical Exam  Constitutional: She appears well-developed and well-nourished. No distress.  HENT:  Head: Normocephalic and atraumatic.  Right Ear: Hearing normal.  Left Ear: Hearing normal.  Nose: Nose normal.  Mouth/Throat: Oropharynx is clear and moist and mucous membranes are normal.  Eyes: Conjunctivae and EOM are normal. Pupils are equal, round, and  reactive to light.  Neck: Normal range of motion. Neck supple.  Cardiovascular: Regular rhythm, S1 normal and S2 normal.  Exam reveals no gallop and no friction rub.   No murmur heard. Pulmonary/Chest: Effort normal and breath sounds normal. No respiratory distress. She exhibits no tenderness.  Abdominal: Soft. Normal appearance and bowel sounds are normal. There is no hepatosplenomegaly. There is no tenderness. There is no rebound, no guarding, no tenderness at McBurney's point and negative Murphy's sign. No hernia.  Musculoskeletal: Normal range of motion.  Neurological: She is alert. She has normal strength. No cranial nerve deficit or sensory deficit. Coordination normal.  Skin: Skin is warm, dry and intact. No rash noted. No cyanosis.  Psychiatric: She has a normal mood and affect. Her speech is normal and behavior is normal. Thought content normal.  Nursing note and vitals reviewed.   ED Course  Procedures (including critical care time) Labs Review Labs Reviewed  URINALYSIS, ROUTINE W REFLEX MICROSCOPIC (NOT AT [facility])  CBC WITH DIFFERENTIAL/PLATELET  BASIC METABOLIC PANEL    Imaging Review No results found.   EKG Interpretation None      MDM   Final diagnoses:  None   UTI  Patient presented to the ER for evaluation of blood in her urine. Family reports that she does use a pull-up and when the change for this morning there was blood-tinged urine in the pull-up. She denies any symptoms. She has normal vital signs. Hemoglobin is normal, white blood cell count is normal. She is afebrile. Urinalysis does show signs of infection. Patient will be initiated on Macrobid. Culture has been sent. Follow-up with PCP next week. Return if symptoms worsen.    Gilda Crease, MD 03/28/15 [phone]

## 2015-03-30 LAB — URINE CULTURE: Culture: 80000

## 2015-03-30 NOTE — Progress Notes (Signed)
ED Antimicrobial Stewardship Positive Culture Follow Up   Melanie Byrd is an 81 y.o. female who presented to [facility] on 03/28/2015 with a chief complaint of  Chief Complaint  Patient presents with  . Hematuria    Recent Results (from the past 720 hour(s))  Urine culture     Status: None   Collection Time: 03/28/15  3:09 PM  Result Value Ref Range Status   Specimen Description URINE, CATHETERIZED  Final   Special Requests NONE  Final   Culture   Final    80,000 COLONIES/ml PSEUDOMONAS AERUGINOSA Performed at [facility]    Report Status 03/30/2015 FINAL  Final   Organism ID, Bacteria PSEUDOMONAS AERUGINOSA  Final      Susceptibility   Pseudomonas aeruginosa - MIC*    CEFTAZIDIME 4 SENSITIVE Sensitive     CIPROFLOXACIN <=0.25 SENSITIVE Sensitive     GENTAMICIN 4 SENSITIVE Sensitive     IMIPENEM 1 SENSITIVE Sensitive     PIP/TAZO <=4 SENSITIVE Sensitive     CEFEPIME 2 SENSITIVE Sensitive     * 80,000 COLONIES/ml PSEUDOMONAS AERUGINOSA    [x]  Treated with Macrobid, organism resistant to prescribed antimicrobial  89 YOM who presented with hematuria. UA was grossly positive however afebrile and WBC wnl. Urine cultures grew Pseudomonas - pan-sensitive.  New antibiotic prescription:  Fosfomycin 3g po x 1 dose  ED Provider: Elson Areas, PA-C   Rolley Sims 03/30/2015, 3:42 PM Infectious Diseases Pharmacist Phone# [phone]

## 2015-03-31 ENCOUNTER — Telehealth (HOSPITAL_BASED_OUTPATIENT_CLINIC_OR_DEPARTMENT_OTHER): Payer: Self-pay | Admitting: Emergency Medicine

## 2015-03-31 NOTE — Telephone Encounter (Signed)
Post ED Visit - Positive Culture Follow-up: Successful Patient Follow-Up  Culture assessed and recommendations reviewed by: [x]  Celedonio Miyamoto, Pharm.D., BCPS-AQ ID []  Georgina Pillion, Pharm.D., BCPS []  [place], [address].D., BCPS, AAHIVP []  Estella Husk, Pharm.D., BCPS, AAHIVP []  Tegan Magsam, Pharm.D. []  Tennis Must, [place].D.  Positive urine  Culture Pseudomonas  []  Patient discharged without antimicrobial prescription and treatment is now indicated [x]  Organism is resistant to prescribed ED discharge antimicrobial []  Patient with positive blood cultures  Changes discussed with ED provider: Cheron Schaumann PA New antibiotic prescription Fosfomycin 3gram po x 1 Called to [facility] @ [phone]  Contacted patient, 03/31/15 1110   Melanie Byrd 03/31/2015, 11:09 AM

## 2015-04-14 ENCOUNTER — Emergency Department (HOSPITAL_COMMUNITY): Payer: Medicare Other

## 2015-04-14 ENCOUNTER — Encounter (HOSPITAL_COMMUNITY): Payer: Self-pay | Admitting: Emergency Medicine

## 2015-04-14 ENCOUNTER — Inpatient Hospital Stay (HOSPITAL_COMMUNITY)
Admission: EM | Admit: 2015-04-14 | Discharge: 2015-04-18 | DRG: 463 | Disposition: A | Discharge status: Home Health | Payer: Medicare Other | Attending: Internal Medicine | Admitting: Internal Medicine

## 2015-04-14 DIAGNOSIS — I10 Essential (primary) hypertension: Secondary | ICD-10-CM | POA: Diagnosis present

## 2015-04-14 DIAGNOSIS — R531 Weakness: Secondary | ICD-10-CM

## 2015-04-14 DIAGNOSIS — I5032 Chronic diastolic (congestive) heart failure: Secondary | ICD-10-CM | POA: Diagnosis present

## 2015-04-14 DIAGNOSIS — M1990 Unspecified osteoarthritis, unspecified site: Secondary | ICD-10-CM | POA: Diagnosis present

## 2015-04-14 DIAGNOSIS — N183 Chronic kidney disease, stage 3 (moderate): Secondary | ICD-10-CM | POA: Diagnosis present

## 2015-04-14 DIAGNOSIS — E780 Pure hypercholesterolemia: Secondary | ICD-10-CM | POA: Diagnosis present

## 2015-04-14 DIAGNOSIS — Z881 Allergy status to other antibiotic agents status: Secondary | ICD-10-CM | POA: Diagnosis not present

## 2015-04-14 DIAGNOSIS — G9341 Metabolic encephalopathy: Secondary | ICD-10-CM | POA: Diagnosis present

## 2015-04-14 DIAGNOSIS — M869 Osteomyelitis, unspecified: Secondary | ICD-10-CM | POA: Diagnosis present

## 2015-04-14 DIAGNOSIS — M4628 Osteomyelitis of vertebra, sacral and sacrococcygeal region: Principal | ICD-10-CM | POA: Diagnosis present

## 2015-04-14 DIAGNOSIS — M609 Myositis, unspecified: Secondary | ICD-10-CM | POA: Diagnosis present

## 2015-04-14 DIAGNOSIS — L8990 Pressure ulcer of unspecified site, unspecified stage: Secondary | ICD-10-CM | POA: Diagnosis not present

## 2015-04-14 DIAGNOSIS — I509 Heart failure, unspecified: Secondary | ICD-10-CM

## 2015-04-14 DIAGNOSIS — L89159 Pressure ulcer of sacral region, unspecified stage: Secondary | ICD-10-CM | POA: Diagnosis present

## 2015-04-14 DIAGNOSIS — I129 Hypertensive chronic kidney disease with stage 1 through stage 4 chronic kidney disease, or unspecified chronic kidney disease: Secondary | ICD-10-CM | POA: Diagnosis present

## 2015-04-14 DIAGNOSIS — Z79899 Other long term (current) drug therapy: Secondary | ICD-10-CM

## 2015-04-14 DIAGNOSIS — N182 Chronic kidney disease, stage 2 (mild): Secondary | ICD-10-CM | POA: Diagnosis not present

## 2015-04-14 DIAGNOSIS — R739 Hyperglycemia, unspecified: Secondary | ICD-10-CM | POA: Diagnosis present

## 2015-04-14 DIAGNOSIS — G9340 Encephalopathy, unspecified: Secondary | ICD-10-CM | POA: Diagnosis present

## 2015-04-14 DIAGNOSIS — E860 Dehydration: Secondary | ICD-10-CM | POA: Diagnosis present

## 2015-04-14 DIAGNOSIS — L89150 Pressure ulcer of sacral region, unstageable: Secondary | ICD-10-CM | POA: Diagnosis present

## 2015-04-14 DIAGNOSIS — Z66 Do not resuscitate: Secondary | ICD-10-CM | POA: Diagnosis present

## 2015-04-14 DIAGNOSIS — L89154 Pressure ulcer of sacral region, stage 4: Secondary | ICD-10-CM

## 2015-04-14 DIAGNOSIS — Z7901 Long term (current) use of anticoagulants: Secondary | ICD-10-CM | POA: Diagnosis not present

## 2015-04-14 DIAGNOSIS — L8995 Pressure ulcer of unspecified site, unstageable: Secondary | ICD-10-CM | POA: Diagnosis present

## 2015-04-14 HISTORY — DX: Chronic kidney disease, unspecified: N18.9

## 2015-04-14 HISTORY — DX: Osteomyelitis, unspecified: M86.9

## 2015-04-14 HISTORY — DX: Unspecified atrial fibrillation: I48.91

## 2015-04-14 HISTORY — DX: Pressure ulcer of unspecified site, unspecified stage: L89.90

## 2015-04-14 HISTORY — DX: Myositis, unspecified: M60.9

## 2015-04-14 LAB — CBC WITH DIFFERENTIAL/PLATELET
Basophils Absolute: 0 10*3/uL (ref 0.0–0.1)
Basophils Relative: 0 % (ref 0–1)
EOS ABS: 0 10*3/uL (ref 0.0–0.7)
EOS PCT: 0 % (ref 0–5)
HCT: 42.2 % (ref 36.0–46.0)
HEMOGLOBIN: 14.1 g/dL (ref 12.0–15.0)
LYMPHS ABS: 1.7 10*3/uL (ref 0.7–4.0)
Lymphocytes Relative: 9 % — ABNORMAL LOW (ref 12–46)
MCH: 29.4 pg (ref 26.0–34.0)
MCHC: 33.4 g/dL (ref 30.0–36.0)
MCV: 87.9 fL (ref 78.0–100.0)
MONOS PCT: 7 % (ref 3–12)
Monocytes Absolute: 1.2 10*3/uL — ABNORMAL HIGH (ref 0.1–1.0)
Neutro Abs: 15.6 10*3/uL — ABNORMAL HIGH (ref 1.7–7.7)
Neutrophils Relative %: 84 % — ABNORMAL HIGH (ref 43–77)
PLATELETS: 240 10*3/uL (ref 150–400)
RBC: 4.8 MIL/uL (ref 3.87–5.11)
RDW: 14.4 % (ref 11.5–15.5)
WBC: 18.5 10*3/uL — AB (ref 4.0–10.5)

## 2015-04-14 LAB — URINALYSIS, ROUTINE W REFLEX MICROSCOPIC
Bilirubin Urine: NEGATIVE
Glucose, UA: NEGATIVE mg/dL
Ketones, ur: NEGATIVE mg/dL
LEUKOCYTES UA: NEGATIVE
Nitrite: NEGATIVE
Protein, ur: 30 mg/dL — AB
SPECIFIC GRAVITY, URINE: 1.02 (ref 1.005–1.030)
Urobilinogen, UA: 0.2 mg/dL (ref 0.0–1.0)
pH: 6 (ref 5.0–8.0)

## 2015-04-14 LAB — COMPREHENSIVE METABOLIC PANEL
ALK PHOS: 76 U/L (ref 38–126)
ALT: 14 U/L (ref 14–54)
ANION GAP: 11 (ref 5–15)
AST: 35 U/L (ref 15–41)
Albumin: 2.7 g/dL — ABNORMAL LOW (ref 3.5–5.0)
BILIRUBIN TOTAL: 1.2 mg/dL (ref 0.3–1.2)
BUN: 23 mg/dL — ABNORMAL HIGH (ref 6–20)
CO2: 26 mmol/L (ref 22–32)
Calcium: 9.7 mg/dL (ref 8.9–10.3)
Chloride: 96 mmol/L — ABNORMAL LOW (ref 101–111)
Creatinine, Ser: 1.52 mg/dL — ABNORMAL HIGH (ref 0.44–1.00)
GFR calc Af Amer: 32 mL/min — ABNORMAL LOW (ref >60)
GFR, EST NON AFRICAN AMERICAN: 28 mL/min — AB (ref >60)
Glucose, Bld: 132 mg/dL — ABNORMAL HIGH (ref 65–99)
Potassium: 4.1 mmol/L (ref 3.5–5.1)
Sodium: 133 mmol/L — ABNORMAL LOW (ref 135–145)
Total Protein: 6.9 g/dL (ref 6.5–8.1)

## 2015-04-14 LAB — C-REACTIVE PROTEIN: CRP: 23 mg/dL — ABNORMAL HIGH (ref <1.0)

## 2015-04-14 LAB — URINE MICROSCOPIC-ADD ON

## 2015-04-14 LAB — SEDIMENTATION RATE: Sed Rate: 34 mm/hr — ABNORMAL HIGH (ref 0–22)

## 2015-04-14 LAB — TROPONIN I: Troponin I: 0.07 ng/mL — ABNORMAL HIGH (ref <0.031)

## 2015-04-14 LAB — I-STAT CG4 LACTIC ACID, ED: LACTIC ACID, VENOUS: 3.25 mmol/L — AB (ref 0.5–2.0)

## 2015-04-14 MED ORDER — AMLODIPINE BESYLATE 5 MG PO TABS
5.0000 mg | ORAL_TABLET | Freq: Every day | ORAL | Status: DC
  Administered 2015-04-16 – 2015-04-18 (×2): 5 mg via ORAL
  Filled 2015-04-14 (×3): qty 1

## 2015-04-14 MED ORDER — AMLODIPINE BESYLATE 5 MG PO TABS
5.0000 mg | ORAL_TABLET | Freq: Every day | ORAL | Status: DC
  Filled 2015-04-14: qty 1

## 2015-04-14 MED ORDER — PIPERACILLIN-TAZOBACTAM IN DEX 2-0.25 GM/50ML IV SOLN
2.2500 g | Freq: Three times a day (TID) | INTRAVENOUS | Status: DC
  Administered 2015-04-15: 2.25 g via INTRAVENOUS
  Filled 2015-04-14 (×2): qty 50

## 2015-04-14 MED ORDER — ONDANSETRON HCL 4 MG PO TABS: 4.0000 mg | ORAL_TABLET | Freq: Four times a day (QID) | ORAL | Status: DC | PRN

## 2015-04-14 MED ORDER — APIXABAN 2.5 MG PO TABS
ORAL_TABLET | ORAL | Status: AC
  Filled 2015-04-14: qty 1

## 2015-04-14 MED ORDER — VANCOMYCIN HCL 500 MG IV SOLR
500.0000 mg | INTRAVENOUS | Status: AC
  Administered 2015-04-15 – 2015-04-17 (×3): 500 mg via INTRAVENOUS
  Filled 2015-04-14 (×4): qty 500

## 2015-04-14 MED ORDER — SODIUM CHLORIDE 0.9 % IV BOLUS (SEPSIS)
1000.0000 mL | Freq: Once | INTRAVENOUS | Status: AC
  Administered 2015-04-14: 1000 mL via INTRAVENOUS

## 2015-04-14 MED ORDER — SODIUM CHLORIDE 0.9 % IV SOLN
INTRAVENOUS | Status: DC
  Administered 2015-04-14: 17:00:00 via INTRAVENOUS
  Administered 2015-04-15: 1 mL via INTRAVENOUS

## 2015-04-14 MED ORDER — AMITRIPTYLINE HCL 10 MG PO TABS
10.0000 mg | ORAL_TABLET | Freq: Every day | ORAL | Status: DC
Start: 2015-04-14 — End: 2015-04-14

## 2015-04-14 MED ORDER — VANCOMYCIN HCL IN DEXTROSE 1-5 GM/200ML-% IV SOLN
1000.0000 mg | Freq: Once | INTRAVENOUS | Status: AC
  Administered 2015-04-14: 1000 mg via INTRAVENOUS
  Filled 2015-04-14: qty 200

## 2015-04-14 MED ORDER — HEPARIN SODIUM (PORCINE) 5000 UNIT/ML IJ SOLN
5000.0000 [IU] | Freq: Three times a day (TID) | INTRAMUSCULAR | Status: DC
Start: 2015-04-14 — End: 2015-04-14

## 2015-04-14 MED ORDER — MORPHINE SULFATE 2 MG/ML IJ SOLN
1.0000 mg | INTRAMUSCULAR | Status: DC | PRN
  Administered 2015-04-14 – 2015-04-17 (×11): 1 mg via INTRAVENOUS
  Filled 2015-04-14 (×11): qty 1

## 2015-04-14 MED ORDER — ONDANSETRON HCL 4 MG/2ML IJ SOLN: 4.0000 mg | Freq: Four times a day (QID) | INTRAMUSCULAR | Status: DC | PRN

## 2015-04-14 MED ORDER — DOCUSATE SODIUM 100 MG PO CAPS
100.0000 mg | ORAL_CAPSULE | Freq: Two times a day (BID) | ORAL | Status: DC
  Administered 2015-04-15 – 2015-04-18 (×6): 100 mg via ORAL
  Filled 2015-04-14 (×6): qty 1

## 2015-04-14 MED ORDER — ACETAMINOPHEN 325 MG PO TABS
650.0000 mg | ORAL_TABLET | Freq: Once | ORAL | Status: AC
  Administered 2015-04-14: 650 mg via ORAL
  Filled 2015-04-14: qty 2

## 2015-04-14 MED ORDER — FOLIC ACID 1 MG PO TABS
1.0000 mg | ORAL_TABLET | Freq: Every morning | ORAL | Status: DC
  Administered 2015-04-16 – 2015-04-18 (×2): 1 mg via ORAL
  Filled 2015-04-14 (×3): qty 1

## 2015-04-14 MED ORDER — PIPERACILLIN-TAZOBACTAM 3.375 G IVPB 30 MIN
3.3750 g | INTRAVENOUS | Status: AC
  Administered 2015-04-14: 3.375 g via INTRAVENOUS
  Filled 2015-04-14: qty 50

## 2015-04-14 MED ORDER — TRAMADOL HCL 50 MG PO TABS
50.0000 mg | ORAL_TABLET | Freq: Every day | ORAL | Status: DC
  Administered 2015-04-15 – 2015-04-18 (×4): 50 mg via ORAL
  Filled 2015-04-14 (×4): qty 1

## 2015-04-14 MED ORDER — AMLODIPINE BESYLATE 5 MG PO TABS
ORAL_TABLET | ORAL | Status: AC
  Filled 2015-04-14: qty 1

## 2015-04-14 MED ORDER — VITAMIN D 1000 UNITS PO TABS
1000.0000 [IU] | ORAL_TABLET | Freq: Every morning | ORAL | Status: DC
  Administered 2015-04-16 – 2015-04-18 (×2): 1000 [IU] via ORAL
  Filled 2015-04-14 (×5): qty 1

## 2015-04-14 MED ORDER — PIPERACILLIN-TAZOBACTAM IN DEX 2-0.25 GM/50ML IV SOLN
INTRAVENOUS | Status: AC
  Filled 2015-04-14: qty 50

## 2015-04-14 MED ORDER — METOPROLOL SUCCINATE ER 50 MG PO TB24
50.0000 mg | ORAL_TABLET | Freq: Every day | ORAL | Status: DC
  Administered 2015-04-15 – 2015-04-18 (×4): 50 mg via ORAL
  Filled 2015-04-14 (×4): qty 1

## 2015-04-14 MED ORDER — SODIUM CHLORIDE 0.9 % IV SOLN
Freq: Once | INTRAVENOUS | Status: AC
  Administered 2015-04-14: 13:00:00 via INTRAVENOUS

## 2015-04-14 MED ORDER — ESCITALOPRAM OXALATE 10 MG PO TABS
10.0000 mg | ORAL_TABLET | Freq: Every day | ORAL | Status: DC
Start: 2015-04-15 — End: 2015-04-19
  Administered 2015-04-16 – 2015-04-18 (×3): 10 mg via ORAL
  Filled 2015-04-14 (×3): qty 1

## 2015-04-14 MED ORDER — APIXABAN 2.5 MG PO TABS
2.5000 mg | ORAL_TABLET | Freq: Two times a day (BID) | ORAL | Status: DC
  Administered 2015-04-15 – 2015-04-18 (×6): 2.5 mg via ORAL
  Filled 2015-04-14 (×14): qty 1

## 2015-04-14 NOTE — ED Notes (Signed)
Patient arrive via EMS from home. Home health nurse reporting bed sores and "deterioration" for several days. Patient alert/oriented. C/o pain to buttocks.

## 2015-04-14 NOTE — H&P (Signed)
[facility] History and Physical  Melanie CruzMaggie Byrd WJX:[id number] DOB: 09/16/1916 DOA: 04/14/2015  Referring physician: ER, Dr. Fayrene FearingJames PCP: Craig StaggersVASIREDDY,SABITHA, MD   Chief Complaint: Pain in lower back.  HPI: Melanie Byrd is a 81 y.o. female  This is a 81 year old lady, who is somewhat drowsy and is unable to give me any clear history. The daughter is at the bedside and tells me that the patient has been living with her for the last 2 months since the daughter sister died 2 months ago. Over the last 2 months, the patient has become less mobile and has been having physical therapy at home. She had been walking with a walker in the last week but in the last few days has been more or less bedbound, sleepy, poor by mouth intake and less communicative in the last 3-4 days. The patient was diagnosed with UTI proximal knee one month ago and placed on anti-biotic. The daughter describes bedsores that of worsened and causing increasing pain in the last few days. Evaluation in the emergency room shows the patient to have decubitus ulcer with foul-smelling discharge. She is now being admitted for further management.   Review of Systems:  Unable to obtain review of systems secondary to the patient's altered mental status/drowsiness.  Past Medical History  Diagnosis Date  . Hypertension   . Hypercholesterolemia   . Renal disorder   . CHF (congestive heart failure)   . Arthritis    Past Surgical History  Procedure Laterality Date  . Hernia repair      about 40years ago    Social History:  reports that she has never smoked. She has never used smokeless tobacco. She reports that she does not drink alcohol or use illicit drugs.  Allergies  Allergen Reactions  . Cephalexin Other (See Comments)    ams    Family history: unable to obtain family history secondary to the patient's altered mental status/drowsiness.  Prior to Admission medications   Medication Sig Start Date End Date Taking? Authorizing  Provider  amitriptyline (ELAVIL) 10 MG tablet Take 10 mg by mouth at bedtime.   Yes Historical Provider, MD  amLODipine (NORVASC) 5 MG tablet Take 5 mg by mouth daily.   Yes Historical Provider, MD  apixaban (ELIQUIS) 2.5 MG TABS tablet Take 2.5 mg by mouth 2 (two) times daily.   Yes Historical Provider, MD  atorvastatin (LIPITOR) 20 MG tablet Take 20 mg by mouth every evening.   Yes Historical Provider, MD  cholecalciferol (VITAMIN D) 1000 UNITS tablet Take 1,000 Units by mouth every morning.   Yes Historical Provider, MD  docusate sodium (COLACE) 100 MG capsule Take 100 mg by mouth 2 (two) times daily.   Yes Historical Provider, MD  escitalopram (LEXAPRO) 10 MG tablet Take 10 mg by mouth daily.   Yes Historical Provider, MD  folic acid ([employer] FOLIC ACID) 800 MCG tablet Take 800 mcg by mouth every morning.   Yes Historical Provider, MD  gabapentin (NEURONTIN) 100 MG capsule Take 100 mg by mouth 3 (three) times daily.   Yes Historical Provider, MD  metoprolol succinate (TOPROL-XL) 50 MG 24 hr tablet Take 50 mg by mouth at bedtime. Take with or immediately following a meal.   Yes Historical Provider, MD  traMADol (ULTRAM) 50 MG tablet Take 50 mg by mouth at bedtime.   Yes Historical Provider, MD  azithromycin (ZITHROMAX) 500 MG tablet Take 1 tablet (500 mg total) by mouth daily. Patient not taking: Reported on 03/08/2015 08/29/14  Erick Blinks, MD  cefUROXime (CEFTIN) 500 MG tablet Take 1 tablet (500 mg total) by mouth 2 (two) times daily with a meal. Patient not taking: Reported on 03/08/2015 08/29/14   Erick Blinks, MD  guaiFENesin (MUCINEX) 600 MG 12 hr tablet Take 1 tablet (600 mg total) by mouth 2 (two) times daily. Patient not taking: Reported on 03/08/2015 08/29/14   Erick Blinks, MD  nitrofurantoin, macrocrystal-monohydrate, (MACROBID) 100 MG capsule Take 1 capsule (100 mg total) by mouth 2 (two) times daily. X 7 days Patient not taking: Reported on 04/14/2015 03/28/15   Gilda Crease, MD   Physical Exam: Filed Vitals:   04/14/15 1515 04/14/15 1530 04/14/15 1545 04/14/15 1600  BP:  115/73  137/102  Pulse: 37 79 76 77  Temp:      TempSrc:      Resp: Height:      Weight:      SpO2: 97% 96% 99% 97%    Wt Readings from Last 3 Encounters:  04/14/15 49.896 kg (110 lb)  03/28/15 48.535 kg (107 lb)  03/08/15 61.236 kg (135 lb)    General:  Appears dehydrated. Drowsy. Eyes: PERRL, normal lids, irises & conjunctiva ENT: grossly normal hearing, lips & tongue Neck: no LAD, masses or thyromegaly Cardiovascular: Heart sounds shows sinus tachycardia with systolic murmur, harsh. Telemetry: SR, no arrhythmias  Respiratory: CTA bilaterally, no w/r/r. Normal respiratory effort. Abdomen: soft, ntnd Skin:Decubitus ulcer with induration and discharge, located approximately 10 cm above sacrum.  Musculoskeletal: grossly normal tone BUE/BLE Psychiatric: grossly normal mood and affect, speech fluent and appropriate Neurologic: grossly non-focal.          Labs on Admission:  Basic Metabolic Panel:  Recent Labs Lab 04/14/15 1309  NA 133*  K 4.1  CL 96*  CO2 26  GLUCOSE 132*  BUN 23*  CREATININE 1.52*  CALCIUM 9.7   Liver Function Tests:  Recent Labs Lab 04/14/15 1309  AST 35  ALT 14  ALKPHOS 76  BILITOT 1.2  PROT 6.9  ALBUMIN 2.7*   No results for input(s): LIPASE, AMYLASE in the last 168 hours. No results for input(s): AMMONIA in the last 168 hours. CBC:  Recent Labs Lab 04/14/15 1309  WBC 18.5*  NEUTROABS 15.6*  HGB 14.1  HCT 42.2  MCV 87.9  PLT 240   Cardiac Enzymes:  Recent Labs Lab 04/14/15 1309  TROPONINI 0.07*    BNP (last 3 results) No results for input(s): BNP in the last 8760 hours.  ProBNP (last 3 results) No results for input(s): PROBNP in the last 8760 hours.  CBG: No results for input(s): GLUCAP in the last 168 hours.  Radiological Exams on Admission: Ct Head Wo Contrast  04/14/2015   CLINICAL  DATA:  81 year old with weakness.  EXAM: CT HEAD WITHOUT CONTRAST  TECHNIQUE: Contiguous axial images were obtained from the base of the skull through the vertex without contrast.  COMPARISON:  03/08/2015  FINDINGS: Stable cerebral atrophy. Stable low-density in the periventricular white matter. No evidence for acute hemorrhage, mass lesion, midline shift, hydrocephalus or large infarct. No acute bone abnormality.  IMPRESSION: No acute intracranial abnormality.  Stable atrophy and white matter changes.   Electronically Signed   By: Richarda Overlie M.D.   On: 04/14/2015 14:20   Dg Chest Port 1 View  04/14/2015   CLINICAL DATA:  Weakness.  Encephalopathy.  EXAM: PORTABLE CHEST - 1 VIEW  COMPARISON:  03/08/2015  FINDINGS: Patient rotated right. The  Chin overlies the right apex. Normal heart size. No pleural effusion or pneumothorax. Low lung volumes with resultant pulmonary interstitial prominence. Mild bibasilar volume loss with probable atelectasis.  IMPRESSION: Bibasilar volume loss with patchy opacities, favored to represent atelectasis.   Electronically Signed   By: Jeronimo Greaves M.D.   On: 04/14/2015 14:07     Assessment/Plan   1. Decubitus ulcer, infected. She will be treated with intravenous anti-biotics and wound care consultation. She may need MRI of the sacral area to see if she has underlying osteomyelitis. 2. Dehydration. IV fluids. 3. Poor mobility. I will last physical therapy to evaluate. She may need skilled nursing facility placement for rehabilitation.   Further recommendations will depend on patient's hospital progress.    Code Status: DO NOT RESUSCITATE. This was confirmed with the daughter at the bedside.  DVT Prophylaxis: heparin.  Family Communication: I discussed the plan with the patient's daughter at the bedside.    Disposition Plan: depending on progress. She may need admission to the skilled nursing facility for rehabilitation.  Time spent: 60 minutes.   Wilson Singer [facility] Pager [phone].

## 2015-04-14 NOTE — ED Notes (Signed)
Patient repositioned to right side.

## 2015-04-14 NOTE — Progress Notes (Signed)
Pt's daughter requesting that pt needs pain medication. Faces pain scale identifies pain of 8/10. MD EPaged.

## 2015-04-14 NOTE — Progress Notes (Addendum)
ANTIBIOTIC CONSULT NOTE - INITIAL  Pharmacy Consult for Vancomycin and Zosyn Indication: pneumonia  Allergies  Allergen Reactions  . Cephalexin Other (See Comments)    ams    Patient Measurements: Height: 5\' 5"  (165.1 cm) Weight: 110 lb (49.896 kg) IBW/kg (Calculated) : 57  Vital Signs: Temp: 98.1 F (36.7 C) (07/04 1156) Temp Source: Oral (07/04 1156) BP: 115/73 mmHg (07/04 1530) Pulse Rate: 76 (07/04 1545) Intake/Output from previous day:   Intake/Output from this shift: Total I/O In: -  Out: 13 [Urine:13]  Labs:  Recent Labs  04/14/15 1309  WBC 18.5*  HGB 14.1  PLT 240  CREATININE 1.52*   Estimated Creatinine Clearance: 16.7 mL/min (by C-G formula based on Cr of 1.52). No results for input(s): VANCOTROUGH, VANCOPEAK, VANCORANDOM, GENTTROUGH, GENTPEAK, GENTRANDOM, TOBRATROUGH, TOBRAPEAK, TOBRARND, AMIKACINPEAK, AMIKACINTROU, AMIKACIN in the last 72 hours.   Microbiology: Recent Results (from the past 720 hour(s))  Urine culture     Status: None   Collection Time: 03/28/15  3:09 PM  Result Value Ref Range Status   Specimen Description URINE, CATHETERIZED  Final   Special Requests NONE  Final   Culture   Final    80,000 COLONIES/ml PSEUDOMONAS AERUGINOSA [facility]    Report Status 03/30/2015 FINAL  Final   Organism ID, Bacteria PSEUDOMONAS AERUGINOSA  Final      Susceptibility   Pseudomonas aeruginosa - MIC*    CEFTAZIDIME 4 SENSITIVE Sensitive     CIPROFLOXACIN <=0.25 SENSITIVE Sensitive     GENTAMICIN 4 SENSITIVE Sensitive     IMIPENEM 1 SENSITIVE Sensitive     PIP/TAZO <=4 SENSITIVE Sensitive     CEFEPIME 2 SENSITIVE Sensitive     * 80,000 COLONIES/ml PSEUDOMONAS AERUGINOSA    Medical History: Past Medical History  Diagnosis Date  . Hypertension   . Hypercholesterolemia   . Renal disorder   . CHF (congestive heart failure)   . Arthritis    Vancomycin 7/4 >>  Assessment: 81yo female with deterioration for several  [facility].  Asked to initiate Vancomycin for suspected pna.  Normalized clcr ~ 17-2623ml/min  Goal of Therapy:  Vancomycin trough level 15-20 mcg/ml  Plan:  Vancomycin 1000mg  IV now x 1 then Vancomycin 500mg  IV q24hrs Check trough at steady state Monitor labs, renal fxn, c/s  Valrie HartHall, Bellanie Matthew A 04/14/2015,4:02 PM   Addendum:  Zosyn added:  3.375gm IV now x 1 then 2.25gm IV q8h (renally adjusted)

## 2015-04-14 NOTE — ED Notes (Signed)
Attempted to give water through straw to administer po meds. Noted to cough on thin liquids with and without straw. Dr. Fayrene FearingJames notified. Completed swallow screen

## 2015-04-14 NOTE — ED Notes (Signed)
Dr Karilyn CotaGosrani informed of failure of swallowing screen. Speech eval/consult ordered per Dr Karilyn CotaGosrani.

## 2015-04-14 NOTE — ED Provider Notes (Signed)
CSN: [id number]     Arrival date & time 04/14/15  1153 History  This chart was scribed for Rolland Porter, MD by Freida Busman, ED Scribe. This patient was seen in room APA19/APA19 and the patient's care was started 12:43 PM.   Chief Complaint  Patient presents with  . Failure To Thrive   LEVEL 5 CAVEAT DUE TO Mental Status    The history is provided by a relative (Daughter). No language interpreter was used.    HPI Comments:  Arleatha Philipps is a 81 y.o. female who presents to the Emergency Department with daughter who reports recent deterioration of the last 4 days. Daughter reports bed sores that have worsened and have caused pt increased pain.  Pt last ambulated with a walker during physical therapy ~ 1 week ago. Pt has been in bed, sleeping more than usual, less communicative with family for the last 3 days and eating less than usual.  Pt was last diagnosed with a UTI ~42month ago and placed on fosfomycin on 03/31/15   PCP Vasireddy in [place]  Past Medical History  Diagnosis Date  . Hypertension   . Hypercholesterolemia   . Renal disorder   . CHF (congestive heart failure)   . Arthritis    Past Surgical History  Procedure Laterality Date  . Hernia repair      about 40years ago    No family history on file. History  Substance Use Topics  . Smoking status: Never Smoker   . Smokeless tobacco: Never Used  . Alcohol Use: No   OB History    Gravida Para Term Preterm AB TAB SAB Ectopic Multiple Living   2 2 2       1      Review of Systems  Unable to perform ROS: Age      Allergies  Cephalexin  Home Medications   Prior to Admission medications   Medication Sig Start Date End Date Taking? Authorizing Provider  amitriptyline (ELAVIL) 10 MG tablet Take 10 mg by mouth at bedtime.   Yes Historical Provider, MD  amLODipine (NORVASC) 5 MG tablet Take 5 mg by mouth daily.   Yes Historical Provider, MD  apixaban (ELIQUIS) 2.5 MG TABS tablet Take 2.5 mg by mouth 2 (two) times daily.    Yes Historical Provider, MD  atorvastatin (LIPITOR) 20 MG tablet Take 20 mg by mouth every evening.   Yes Historical Provider, MD  cholecalciferol (VITAMIN D) 1000 UNITS tablet Take 1,000 Units by mouth every morning.   Yes Historical Provider, MD  docusate sodium (COLACE) 100 MG capsule Take 100 mg by mouth 2 (two) times daily.   Yes Historical Provider, MD  escitalopram (LEXAPRO) 10 MG tablet Take 10 mg by mouth daily.   Yes Historical Provider, MD  folic acid ([employer]S FOLIC ACID) 800 MCG tablet Take 800 mcg by mouth every morning.   Yes Historical Provider, MD  gabapentin (NEURONTIN) 100 MG capsule Take 100 mg by mouth 3 (three) times daily.   Yes Historical Provider, MD  metoprolol succinate (TOPROL-XL) 50 MG 24 hr tablet Take 50 mg by mouth at bedtime. Take with or immediately following a meal.   Yes Historical Provider, MD  traMADol (ULTRAM) 50 MG tablet Take 50 mg by mouth at bedtime.   Yes Historical Provider, MD  azithromycin (ZITHROMAX) 500 MG tablet Take 1 tablet (500 mg total) by mouth daily. Patient not taking: Reported on 03/08/2015 08/29/14   Erick Blinks, MD  cefUROXime (CEFTIN) 500 MG tablet Take  1 tablet (500 mg total) by mouth 2 (two) times daily with a meal. Patient not taking: Reported on 03/08/2015 08/29/14   Erick Blinks, MD  guaiFENesin (MUCINEX) 600 MG 12 hr tablet Take 1 tablet (600 mg total) by mouth 2 (two) times daily. Patient not taking: Reported on 03/08/2015 08/29/14   Erick Blinks, MD  nitrofurantoin, macrocrystal-monohydrate, (MACROBID) 100 MG capsule Take 1 capsule (100 mg total) by mouth 2 (two) times daily. X 7 days Patient not taking: Reported on 04/14/2015 03/28/15   Gilda Crease, MD   BP 137/102 mmHg  Pulse 77  Temp(Src) 98.1 F (36.7 C) (Oral)  Resp 21  Ht  (1.651 m)  Wt 110 lb (49.896 kg)  BMI 18.31 kg/m2  SpO2 97% Physical Exam  Constitutional: She appears well-developed. No distress.  HENT:  Head: Normocephalic.  Mouth/Throat:  Oropharynx is clear and moist.  Eyes: Conjunctivae are normal. Pupils are equal, round, and reactive to light. No scleral icterus.  Neck: Normal range of motion. Neck supple. No thyromegaly present.  Cardiovascular: Normal rate and regular rhythm.  Exam reveals no gallop and no friction rub.   No murmur heard. Pulmonary/Chest: Effort normal and breath sounds normal. No respiratory distress. She has no wheezes. She has no rales.  Abdominal: Soft. Bowel sounds are normal. She exhibits no distension. There is no tenderness. There is no rebound.  Musculoskeletal: Normal range of motion.  Neurological: She is alert.  Skin: Skin is warm and dry.  Decubitus ulcer ~10 x 12 cm above sacrum with induration and full thickness drainage about 2 cm in diameter    Psychiatric: She has a normal mood and affect. Her behavior is normal.  Nursing note and vitals reviewed.   ED Course  Procedures   DIAGNOSTIC STUDIES:  Oxygen Saturation is 97% on RA, normal by my interpretation.    COORDINATION OF CARE:  12:35 PM Will order UA, and blood work. Discussed treatment plan with pt's family at bedside  Labs Review Labs Reviewed  CBC WITH DIFFERENTIAL/PLATELET - Abnormal; Notable for the following:    WBC 18.5 (*)    Neutrophils Relative % 84 (*)    Neutro Abs 15.6 (*)    Lymphocytes Relative 9 (*)    Monocytes Absolute 1.2 (*)    All other components within normal limits  COMPREHENSIVE METABOLIC PANEL - Abnormal; Notable for the following:    Sodium 133 (*)    Chloride 96 (*)    Glucose, Bld 132 (*)    BUN 23 (*)    Creatinine, Ser 1.52 (*)    Albumin 2.7 (*)    GFR calc non Af Amer 28 (*)    GFR calc Af Amer 32 (*)    All other components within normal limits  URINALYSIS, ROUTINE W REFLEX MICROSCOPIC (NOT AT [facility]) - Abnormal; Notable for the following:    Hgb urine dipstick MODERATE (*)    Protein, ur 30 (*)    All other components within normal limits  TROPONIN I - Abnormal; Notable for the  following:    Troponin I 0.07 (*)    All other components within normal limits  URINE MICROSCOPIC-ADD ON - Abnormal; Notable for the following:    Squamous Epithelial / LPF FEW (*)    Bacteria, UA FEW (*)    All other components within normal limits  SEDIMENTATION RATE - Abnormal; Notable for the following:    Sed Rate 34 (*)    All other components within normal limits  I-STAT CG4 LACTIC ACID, ED - Abnormal; Notable for the following:    Lactic Acid, Venous 3.25 (*)    All other components within normal limits  URINE CULTURE  CULTURE, BLOOD (ROUTINE X 2)  CULTURE, BLOOD (ROUTINE X 2)  C-REACTIVE PROTEIN  CBC  CREATININE, SERUM    Imaging Review Ct Head Wo Contrast  04/14/2015   CLINICAL DATA:  81 year old with weakness.  EXAM: CT HEAD WITHOUT CONTRAST  TECHNIQUE: Contiguous axial images were obtained from the base of the skull through the vertex without contrast.  COMPARISON:  03/08/2015  FINDINGS: Stable cerebral atrophy. Stable low-density in the periventricular white matter. No evidence for acute hemorrhage, mass lesion, midline shift, hydrocephalus or large infarct. No acute bone abnormality.  IMPRESSION: No acute intracranial abnormality.  Stable atrophy and white matter changes.   Electronically Signed   By: Richarda OverlieAdam  Henn M.D.   On: 04/14/2015 14:20   Dg Chest Port 1 View  04/14/2015   CLINICAL DATA:  Weakness.  Encephalopathy.  EXAM: PORTABLE CHEST - 1 VIEW  COMPARISON:  03/08/2015  FINDINGS: Patient rotated right. The Chin overlies the right apex. Normal heart size. No pleural effusion or pneumothorax. Low lung volumes with resultant pulmonary interstitial prominence. Mild bibasilar volume loss with probable atelectasis.  IMPRESSION: Bibasilar volume loss with patchy opacities, favored to represent atelectasis.   Electronically Signed   By: Jeronimo GreavesKyle  Talbot M.D.   On: 04/14/2015 14:07     EKG Interpretation None      MDM   Final diagnoses:  Weak  Sacral decubitus ulcer, stage IV     I personally performed the services described in this documentation, which was scribed in my presence. The recorded information has been reviewed and is accurate.    Rolland PorterMark Devorah Givhan, MD 04/14/15 [phone]

## 2015-04-15 ENCOUNTER — Inpatient Hospital Stay (HOSPITAL_COMMUNITY): Payer: Medicare Other

## 2015-04-15 ENCOUNTER — Encounter (HOSPITAL_COMMUNITY): Payer: Self-pay | Admitting: Internal Medicine

## 2015-04-15 DIAGNOSIS — I509 Heart failure, unspecified: Secondary | ICD-10-CM

## 2015-04-15 DIAGNOSIS — I10 Essential (primary) hypertension: Secondary | ICD-10-CM | POA: Diagnosis present

## 2015-04-15 DIAGNOSIS — M869 Osteomyelitis, unspecified: Secondary | ICD-10-CM | POA: Diagnosis present

## 2015-04-15 DIAGNOSIS — G9340 Encephalopathy, unspecified: Secondary | ICD-10-CM

## 2015-04-15 DIAGNOSIS — L8995 Pressure ulcer of unspecified site, unstageable: Secondary | ICD-10-CM | POA: Diagnosis present

## 2015-04-15 DIAGNOSIS — R739 Hyperglycemia, unspecified: Secondary | ICD-10-CM | POA: Diagnosis present

## 2015-04-15 DIAGNOSIS — N182 Chronic kidney disease, stage 2 (mild): Secondary | ICD-10-CM | POA: Diagnosis present

## 2015-04-15 DIAGNOSIS — M609 Myositis, unspecified: Secondary | ICD-10-CM | POA: Diagnosis present

## 2015-04-15 LAB — COMPREHENSIVE METABOLIC PANEL
ALT: 11 U/L — ABNORMAL LOW (ref 14–54)
ANION GAP: 8 (ref 5–15)
AST: 26 U/L (ref 15–41)
Albumin: 1.9 g/dL — ABNORMAL LOW (ref 3.5–5.0)
Alkaline Phosphatase: 56 U/L (ref 38–126)
BILIRUBIN TOTAL: 1.3 mg/dL — AB (ref 0.3–1.2)
BUN: 18 mg/dL (ref 6–20)
CALCIUM: 8.4 mg/dL — AB (ref 8.9–10.3)
CHLORIDE: 103 mmol/L (ref 101–111)
CO2: 23 mmol/L (ref 22–32)
CREATININE: 1.17 mg/dL — AB (ref 0.44–1.00)
GFR calc Af Amer: 44 mL/min — ABNORMAL LOW (ref >60)
GFR calc non Af Amer: 38 mL/min — ABNORMAL LOW (ref >60)
Glucose, Bld: 122 mg/dL — ABNORMAL HIGH (ref 65–99)
Potassium: 3.5 mmol/L (ref 3.5–5.1)
SODIUM: 134 mmol/L — AB (ref 135–145)
Total Protein: 5.2 g/dL — ABNORMAL LOW (ref 6.5–8.1)

## 2015-04-15 LAB — CBC
HEMATOCRIT: 31.3 % — AB (ref 36.0–46.0)
Hemoglobin: 10.5 g/dL — ABNORMAL LOW (ref 12.0–15.0)
MCH: 29.2 pg (ref 26.0–34.0)
MCHC: 33.5 g/dL (ref 30.0–36.0)
MCV: 86.9 fL (ref 78.0–100.0)
Platelets: 239 10*3/uL (ref 150–400)
RBC: 3.6 MIL/uL — ABNORMAL LOW (ref 3.87–5.11)
RDW: 14.4 % (ref 11.5–15.5)
WBC: 14.9 10*3/uL — AB (ref 4.0–10.5)

## 2015-04-15 MED ORDER — PIPERACILLIN-TAZOBACTAM 3.375 G IVPB
3.3750 g | Freq: Three times a day (TID) | INTRAVENOUS | Status: DC
  Administered 2015-04-15 – 2015-04-18 (×9): 3.375 g via INTRAVENOUS
  Filled 2015-04-15 (×18): qty 50

## 2015-04-15 MED ORDER — CETYLPYRIDINIUM CHLORIDE 0.05 % MT LIQD
7.0000 mL | Freq: Two times a day (BID) | OROMUCOSAL | Status: DC
  Administered 2015-04-15 – 2015-04-17 (×6): 7 mL via OROMUCOSAL

## 2015-04-15 MED ORDER — DEXTROSE 5 % IV SOLN
2.2500 g | Freq: Three times a day (TID) | INTRAVENOUS | Status: DC
  Administered 2015-04-15: 2.25 g via INTRAVENOUS
  Filled 2015-04-15 (×4): qty 2.25

## 2015-04-15 MED ORDER — RESOURCE THICKENUP CLEAR PO POWD
ORAL | Status: DC | PRN
Start: 2015-04-15 — End: 2015-04-19
  Administered 2015-04-15: 19:00:00 via ORAL
  Filled 2015-04-15: qty 125

## 2015-04-15 MED ORDER — DIPHENHYDRAMINE HCL 50 MG/ML IJ SOLN
12.5000 mg | Freq: Three times a day (TID) | INTRAMUSCULAR | Status: DC | PRN
  Administered 2015-04-15 – 2015-04-16 (×3): 12.5 mg via INTRAVENOUS
  Filled 2015-04-15 (×3): qty 1

## 2015-04-15 MED ORDER — COLLAGENASE 250 UNIT/GM EX OINT
TOPICAL_OINTMENT | Freq: Every day | CUTANEOUS | Status: DC
  Administered 2015-04-15 – 2015-04-18 (×4): via TOPICAL
  Filled 2015-04-15: qty 30

## 2015-04-15 MED ORDER — ACETAMINOPHEN 650 MG RE SUPP
650.0000 mg | Freq: Four times a day (QID) | RECTAL | Status: DC | PRN
  Administered 2015-04-15 – 2015-04-18 (×4): 650 mg via RECTAL
  Filled 2015-04-15 (×5): qty 1

## 2015-04-15 MED ORDER — SODIUM CHLORIDE 0.9 % IV SOLN
INTRAVENOUS | Status: AC
  Administered 2015-04-15: 23:00:00 via INTRAVENOUS

## 2015-04-15 NOTE — Progress Notes (Signed)
[facility] PROGRESS NOTE  Melanie Byrd RUE:[id number] DOB: March 19, 1916 DOA: 04/14/2015 PCP: Craig Staggers, MD  Assessment/Plan:  Acute encephalopathy: Somewhat improved this a.m. Likely related to infectious process and mild dehydration. CT of the head without acute intracranial abnormality. Continue gentle IV fluids and anabiotic for her osteomyelitis. Max temp 100.7 rectally this afternoon. Cultures are pending. He is hemodynamically stable and not hypoxic. Will continue IV fluids and anabiotic. Continue to monitor   Pressure ulcer, unstageable, with eschar: Evaluated by wound ostomy nurse. Note indicates unstageable pressure ulcer to sacrum and coccyx with additional moisture associated skin damage to bilateral gluteal folds. 100 amount of purulent drainage with very foul odor. MRI indicates osteomyelitis. Continue Vanco and Zosyn as noted above. Will request surgical consult per recommendation wound ostomy nurse. Low air loss mattress requested as well. Follow recommendations for dressing changes.    Osteomyelitis/ Myositis: Per MRI. See #2. Thank and Zosyn day 2. Blood cultures pending.  CKD. Stage III. Current creatinine somewhat below baseline. IV fluids as noted above.  Questionable dysphagia.. Early patient failed swallow eval last night. Likely related to #1. Speech therapy has been requested for evaluation. She remains encephalopathic but improved from last night. Will keep her nothing by mouth until #1 resolved and/or speech therapy has evaluated    Dehydration: Due to decreased by mouth intake. Gentle IV fluids. Monitor closely    Hypertension: Controlled. Home medications include Norvasc metoprolol. Norvasc on hold. Continue metoprolol    CHF (congestive heart failure): History diastolic dysfunction. Currently signs symptoms of overload. Will monitor intake and output obtain daily weights. Opinion beta blocker    Code Status: DNR Family Communication: daughter at  bedside Disposition Plan: may need facility   Consultants:  General surgery  Procedures:  none  Antibiotics:  Vancomycin 04/14/15>>  Zosyn 04/14/15>>  HPI/Subjective: Lying in bed. Facial grimace and moaning to repositioning  Objective: Filed Vitals:   04/15/15 1252  BP:   Pulse:   Temp: 100.7 F (38.2 C)  Resp:     Intake/Output Summary (Last 24 hours) at 04/15/15 1416 Last data filed at 04/15/15 1048  Gross per 24 hour  Intake   1400 ml  Output      0 ml  Net   1400 ml   Filed Weights   04/14/15 1156 04/14/15 1848  Weight: 49.896 kg (110 lb) 51.665 kg (113 lb 14.4 oz)    Exam:   General:  Facial grimace appears somewhat uncomfortable otherwise looks good for her stated 81 years  Cardiovascular: Irregularly irregular positive murmur no lower extremity edema  Respiratory: No increased work of breathing respirations somewhat shallow sounds somewhat distant no wheeze no crackles  Abdomen: Rounded but soft positive bowel sounds nontender to palpation  Musculoskeletal: Joints without clubbing or cyanosis  : Decubitus ulcer at sacrum with moderate amount of purulent drainage and very foul odor  Data Reviewed: Basic Metabolic Panel:  Recent Labs Lab 04/14/15 1309 04/15/15 0628  NA 133* 134*  K 4.1 3.5  CL 96* 103  CO2 26 23  GLUCOSE 132* 122*  BUN 23* 18  CREATININE 1.52* 1.17*  CALCIUM 9.7 8.4*   Liver Function Tests:  Recent Labs Lab 04/14/15 1309 04/15/15 0628  AST 35 26  ALT 14 11*  ALKPHOS 76 56  BILITOT 1.2 1.3*  PROT 6.9 5.2*  ALBUMIN 2.7* 1.9*   No results for input(s): LIPASE, AMYLASE in the last 168 hours. No results for input(s): AMMONIA in the last 168 hours. CBC:  Recent  Labs Lab 04/14/15 1309 04/15/15 0628  WBC 18.5* 14.9*  NEUTROABS 15.6*  --   HGB 14.1 10.5*  HCT 42.2 31.3*  MCV 87.9 86.9  PLT 240 239   Cardiac Enzymes:  Recent Labs Lab 04/14/15 1309  TROPONINI 0.07*   BNP (last 3 results) No results  for input(s): BNP in the last 8760 hours.  ProBNP (last 3 results) No results for input(s): PROBNP in the last 8760 hours.  CBG: No results for input(s): GLUCAP in the last 168 hours.  Recent Results (from the past 240 hour(s))  Urine culture     Status: None (Preliminary result)   Collection Time: 04/14/15  1:30 PM  Result Value Ref Range Status   Specimen Description URINE, CATHETERIZED  Final   Special Requests NONE  Final   Culture   Final    NO GROWTH < 24 HOURS Performed at [facility]    Report Status PENDING  Incomplete     Studies: Ct Head Wo Contrast  04/14/2015   CLINICAL DATA:  81 year old with weakness.  EXAM: CT HEAD WITHOUT CONTRAST  TECHNIQUE: Contiguous axial images were obtained from the base of the skull through the vertex without contrast.  COMPARISON:  03/08/2015  FINDINGS: Stable cerebral atrophy. Stable low-density in the periventricular white matter. No evidence for acute hemorrhage, mass lesion, midline shift, hydrocephalus or large infarct. No acute bone abnormality.  IMPRESSION: No acute intracranial abnormality.  Stable atrophy and white matter changes.   Electronically Signed   By: Richarda Overlie M.D.   On: 04/14/2015 14:20   Mr Sacrum/si Joints Wo Contrast  04/15/2015   CLINICAL DATA:  Decubitus ulcer.  Osteomyelitis of the sacrum.  EXAM: MR SACRUM WITHOUT CONTRAST  TECHNIQUE: Multiplanar, multisequence MR imaging was performed. No intravenous contrast was administered.  COMPARISON:  None.  FINDINGS: The study is degraded by motion artifact.Coccygeal osteomyelitis is present with bone marrow edema in the proximal coccygeal segments. There is a deep decubitus ulcer that extends from the RIGHT side of the gluteal cleft to the surface of the coccyx. There is loss of the normal posterior coccygeal cortex. These findings are best seen on the sagittal images. Phlegmon is present in the dorsal sacral soft tissues, representing a combination of subcutaneous edema and  likely infectious myositis in the gluteus maximus muscles. There is a left-sided bladder diverticulum incidentally noted on the visible portions of the visceral pelvis.  IMPRESSION: Coccygeal osteomyelitis with deep decubitus ulcer extending to the dorsal coccygeal surface. Surrounding cellulitis a on infectious myositis without an abscess.   Electronically Signed   By: Andreas Newport M.D.   On: 04/15/2015 10:52   Dg Chest Port 1 View  04/14/2015   CLINICAL DATA:  Weakness.  Encephalopathy.  EXAM: PORTABLE CHEST - 1 VIEW  COMPARISON:  03/08/2015  FINDINGS: Patient rotated right. The Chin overlies the right apex. Normal heart size. No pleural effusion or pneumothorax. Low lung volumes with resultant pulmonary interstitial prominence. Mild bibasilar volume loss with probable atelectasis.  IMPRESSION: Bibasilar volume loss with patchy opacities, favored to represent atelectasis.   Electronically Signed   By: Jeronimo Greaves M.D.   On: 04/14/2015 14:07    Scheduled Meds: . amLODipine  5 mg Oral Daily  . antiseptic oral rinse  7 mL Mouth Rinse BID  . apixaban  2.5 mg Oral BID  . cholecalciferol  1,000 Units Oral q morning - 10a  . collagenase   Topical Daily  . docusate sodium  100 mg  Oral BID  . escitalopram  10 mg Oral Daily  . folic acid  1 mg Oral q morning - 10a  . metoprolol succinate  50 mg Oral QHS  . piperacillin-tazobactam (ZOSYN)  IV  3.375 g Intravenous Q8H  . traMADol  50 mg Oral QHS  . vancomycin  500 mg Intravenous Q24H   Continuous Infusions:   Active Problems:     Time spent: 35 minutes    [facility] Ger[facility] Psychiatry CenterBLACK,KAREN M  [facility] Pager [phone]. If 7PM-7AM, please contact night-coverage at [email or web address], password [email or web address] 04/15/2015, 2:16 PM  LOS: 1 day

## 2015-04-15 NOTE — Consult Note (Addendum)
WOC wound follow up Wound type: Unstageable pressure ulcer to sacrum and coccyx with additional Moisture Associated Skin Damage (MASD) to bilateral gluteal folds.  Measurement: Sacral ulcer 2 cm x 1.5 cm, unable to visualize wound bed due to the presence of thick, adherent eschar.  Wound has foul odor of necrosis.  Bilateral upper gluteal folds, near the cleft of the apex have MASD from urinary and fecal incontinence.  Foul smell present. Measuring 7 cm x 5 cm x 0.2 cm on each side.  Wound bed:100% devitalized tissue.  Drainage (amount, consistency, odor) Moderate purulent drainage, foul necrotic odor present.  MRI indicates osteomyelitis of sacrum.  Being treated with Vancomycin and Zosyn.  Recommend a surgical consult to determine if surgical debridement would be appropriate at this time. Albumin is 1.9 today.  Will also order a low air loss mattress to offload pressure.  Please turn and reposition every 2 hours.  Periwound:Erythema and induration present to sacral periwound Dressing procedure/placement/frequency:Cleanse sacral/buttock wounds with NS and pat gently dry.  Apply Santyl ointment to wound bed, 1/8 inch thickness (opaque). Cover dead space in sacral wound with NS moist 2x2.   Cover wounds with NS moist 4x4 gauze and Cover with ABD pad and tape.  Change daily.   Conservative sharp wound debridement (CSWD performed at the bedside): None.  Have suggested surgical consult, if MD agrees, please order.  Will not follow at this time.  Please re-consult if needed.  Maple HudsonKaren Jupiter Boys RN BSN CWON Pager [phone]

## 2015-04-15 NOTE — Progress Notes (Signed)
ANTIBIOTIC CONSULT NOTE  Pharmacy Consult for Vancomycin and Zosyn  Indication: Decubitus Ulcer , PNA  Allergies  Allergen Reactions  . Cephalexin Other (See Comments)    ams   Patient Measurements: Height: 5\' 5"  (165.1 cm) Weight: 113 lb 14.4 oz (51.665 kg) IBW/kg (Calculated) : 57  Vital Signs: Temp: 98.5 F (36.9 C) (07/05 1035) Temp Source: Oral (07/05 1035) BP: 133/77 mmHg (07/05 1035) Pulse Rate: 88 (07/05 1035) Intake/Output from previous day: 07/04 0701 - 07/05 0700 In: 1350 [I.V.:1300; IV Piggyback:50] Out: 13 [Urine:13] Intake/Output from this shift: Total I/O In: 50 [IV Piggyback:50] Out: -   Labs:  Recent Labs  04/14/15 1309 04/15/15 0628  WBC 18.5* 14.9*  HGB 14.1 10.5*  PLT 240 239  CREATININE 1.52* 1.17*   Estimated Creatinine Clearance: 22.4 mL/min (by C-G formula based on Cr of 1.17). No results for input(s): VANCOTROUGH, VANCOPEAK, VANCORANDOM, GENTTROUGH, GENTPEAK, GENTRANDOM, TOBRATROUGH, TOBRAPEAK, TOBRARND, AMIKACINPEAK, AMIKACINTROU, AMIKACIN in the last 72 hours.   Microbiology: Recent Results (from the past 720 hour(s))  Urine culture     Status: None   Collection Time: 03/28/15  3:09 PM  Result Value Ref Range Status   Specimen Description URINE, CATHETERIZED  Final   Special Requests NONE  Final   Culture   Final    80,000 COLONIES/ml PSEUDOMONAS AERUGINOSA [facility]    Report Status 03/30/2015 FINAL  Final   Organism ID, Bacteria PSEUDOMONAS AERUGINOSA  Final      Susceptibility   Pseudomonas aeruginosa - MIC*    CEFTAZIDIME 4 SENSITIVE Sensitive     CIPROFLOXACIN <=0.25 SENSITIVE Sensitive     GENTAMICIN 4 SENSITIVE Sensitive     IMIPENEM 1 SENSITIVE Sensitive     PIP/TAZO <=4 SENSITIVE Sensitive     CEFEPIME 2 SENSITIVE Sensitive     * 80,000 COLONIES/ml PSEUDOMONAS AERUGINOSA    Anti-infectives    Start     Dose/Rate Route Frequency Ordered Stop   04/15/15 1800  piperacillin-tazobactam (ZOSYN)  IVPB 3.375 g     3.375 g 12.5 mL/hr over 240 Minutes Intravenous Every 8 hours 04/15/15 1047     04/15/15 1600  vancomycin (VANCOCIN) 500 mg in sodium chloride 0.9 % 100 mL IVPB     500 mg 100 mL/hr over 60 Minutes Intravenous Every 24 hours 04/14/15 2200     04/15/15 1000  piperacillin-tazobactam (ZOSYN) 2.25 g in dextrose 5 % 50 mL IVPB  Status:  Discontinued     2.25 g 100 mL/hr over 30 Minutes Intravenous Every 8 hours 04/15/15 0808 04/15/15 1047   04/15/15 0200  piperacillin-tazobactam (ZOSYN) IVPB 2.25 g  Status:  Discontinued     2.25 g 100 mL/hr over 30 Minutes Intravenous Every 8 hours 04/14/15 2200 04/15/15 0808   04/14/15 1715  piperacillin-tazobactam (ZOSYN) IVPB 3.375 g     3.375 g 100 mL/hr over 30 Minutes Intravenous STAT 04/14/15 1709 04/14/15 1807   04/14/15 1600  vancomycin (VANCOCIN) IVPB 1000 mg/200 mL premix     1,000 mg 200 mL/hr over 60 Minutes Intravenous  Once 04/14/15 1559 04/14/15 1724     Assessment: Okay for Protocol, improved renal function, micro pending.  Goal of Therapy:  Vancomycin trough level 15-20 mcg/ml  Plan:  Increase Zosyn to 3.375gm IV every 8 hours. Continue Vancomycin 500mg  IV every 24 hours. Measure antibiotic drug levels at steady state Follow up culture results  Mady GemmaHayes, Dorwin Fitzhenry R 04/15/2015,10:51 AM

## 2015-04-15 NOTE — Progress Notes (Signed)
Patient ID: Melanie Byrd, female   DOB: 11/09/1915, 81 y.o.   MRN: [id number] Admission for sacral decubitus ulcer.  She is on eliquis now.  Not septic per hospitalists.  Discussed with them, will stop eliquis and will see in am unless something changes

## 2015-04-15 NOTE — Progress Notes (Signed)
Initial Nutrition Assessment  DOCUMENTATION CODES: Underweight  INTERVENTION: Recommend Zinc, Vit C supplements to promote wound healing.   Once diet advanced, will order prostat BID, (each supplement provides 100 kcal, 15 g Pro)  Recommend pt discharge with MVI-doesn't take one at home  Will monitor oral intake and order high protein snacks or different supplements as needed.   NUTRITION DIAGNOSIS: Increased protein needs related to unstageable Pressure Ulcer as evidenced by estimated nutrient requirements for the condition  GOAL: Patient will meet greater than or equal to 90% of their needs  MONITOR: PO intake, Supplement acceptance, Diet advancement, Labs, Skin  REASON FOR ASSESSMENT: Low Braden    ASSESSMENT: 81 y/o lady PMHx: CHF, HTN, Hypercholesterolemia, CHF. Pt, in the last few days, has been more or less bedbound, sleepy, poor PO intake and less communicative.  Eval ED shows the patient to have decubitus ulcer with foul-smelling discharge  Multiple family members present on RD arrival. Pt was completely covered except for face.   Daughter reports that since Saturday pt has not had her usually appetite. She would only eat bites. Pt also became much more somnolent. Pt took Folate and Vitamin D at home. She has tried Boost in the past and hated it.   Daughter reports that pt weighed 121-135 lbs one year ago. More recently, she weighed 107 lbs about a month ago. This would indicate the pt gained a few pounds since that time.  Explained to family the need for increased protein consumption to better help wound heal. Out of the supplements I offered, they thought pt would like prostat the most. Will order once diet advanced.   Pt failed swallow eval last night, thought to be related to acute encephalopathy. Speech following and pt to be NPO until swallowing difficulties resolve.   Though likely malnourished was unable to do physical exam, thusly unable to dx with  malnutrition. Will attempt NFPE on f/u.   Height: Ht Readings from Last 1 Encounters:  04/14/15 5\' 5"  (1.651 m)    Weight: Wt Readings from Last 1 Encounters:  04/14/15 113 lb 14.4 oz (51.665 kg)    Ideal Body Weight:  56.8 kg  Wt Readings from Last 10 Encounters:  04/14/15 113 lb 14.4 oz (51.665 kg)  04/15/15 113 lb (51.256 kg)  03/28/15 107 lb (48.535 kg)  03/08/15 135 lb (61.236 kg)  08/27/14 121 lb 3.2 oz (54.976 kg)  Admit weight 110 lbs (50 kg)  BMI:  Body mass index is 18.95 kg/(m^2).  BMI using admit weight: 18.3-underweight  Estimated Nutritional Needs: Kcal:  1250-1350 (25-27 kcal/kg) Protein:  68-80 g Pro (1.2-1.4 g/kg) Fluid:  1.3-1.4 liters   Skin: Unstageable Pressure Ulcer to sacrum with MSAD surrounding. MRI shows osteomyelitis of sacrum  Diet Order:   NPO  EDUCATION NEEDS:  No education needs identified at this time   Intake/Output Summary (Last 24 hours) at 04/15/15 1434 Last data filed at 04/15/15 1048  Gross per 24 hour  Intake   1400 ml  Output      0 ml  Net   1400 ml    Last BM: 7/4  Melanie LouisNathan Arjun Byrd RD, LDN Nutrition Pager: [phone] 04/15/2015 2:34 PM

## 2015-04-15 NOTE — Progress Notes (Signed)
Pt's last recorded void was at approximately 0500 today.  Bladder scan revealed approximately 300 cc's.  Dr. Ardyth HarpsHernandez at the bedside and made aware.  New order to wait another 8 hours and bladder scan patient again.  Will continue to monitor.

## 2015-04-15 NOTE — Evaluation (Signed)
Clinical/Bedside Swallow Evaluation Patient Details  Name: Melanie CruzMaggie Byrd MRN: [id number] Date of Birth: 01/30/1916  Today's Date: 04/15/2015 Time: SLP Start Time (ACUTE ONLY): 1240 SLP Stop Time (ACUTE ONLY): 1304 SLP Time Calculation (min) (ACUTE ONLY): 24 min  Past Medical History:  Past Medical History  Diagnosis Date  . Hypertension   . Hypercholesterolemia   . Renal disorder   . CHF (congestive heart failure)   . Arthritis   . Pressure ulcer     unstageable coccyx and sacrum with eschar  . Osteomyelitis     coccyx  . Myositis   . CKD (chronic kidney disease)    Past Surgical History:  Past Surgical History  Procedure Laterality Date  . Hernia repair      about 40years ago    HPI:  This is a 81 year old lady, who is somewhat drowsy and is unable to give me any clear history. The daughter is at the bedside and tells me that the patient has been living with her for the last 2 months since the daughter sister died 2 months ago. Over the last 2 months, the patient has become less mobile and has been having physical therapy at home. She had been walking with a walker in the last week but in the last few days has been more or less bedbound, sleepy, poor by mouth intake and less communicative in the last 3-4 days. The patient was diagnosed with UTI proximal knee one month ago and placed on anti-biotic. The daughter describes bedsores that of worsened and causing increasing pain in the last few days. Evaluation in the emergency room shows the patient to have decubitus ulcer with foul-smelling discharge. She is now being admitted for further management.   Assessment / Plan / Recommendation Clinical Impression  Melanie Byrd was alert and cooperative for clinical swallow evaluation. Family at bedside and report that PTA, pt typically feeds self but has needed assist for the past 2 days. She apparently got choked last night when taking her medications. Family states that she typically does not have  difficulty swallowing. Pt currently has painful decubitus and does not tolerate sitting fully upright in bed, so SLP assessed pt sitting at ~40 degrees. Pt requires encouragement for participation and hand over hand assist for self presentation of cup sips. Mild decreased oral control resulting in labial spillage and delayed cough after thin liquids (weak cough). Pt tolerated cracker when given extra time for bolus manipulation. Pt is at mild risk for aspiration given weak cough and eating in slightly declined position. Recommend D2/chopped and nectar-thick liquids with feeder assist. SLP to follow for diet tolerance and upgrades as appropriate.    Aspiration Risk  Mild    Diet Recommendation Dysphagia 2 (Fine chop);Nectar   Medication Administration: Whole meds with puree Compensations: Slow rate;Small sips/bites    Other  Recommendations Oral Care Recommendations: Oral care BID;Staff/trained caregiver to provide oral care Other Recommendations: Clarify dietary restrictions   Follow Up Recommendations       Frequency and Duration min 2x/week  1 week   Pertinent Vitals/Pain VSS    SLP Swallow Goals   Pt will demonstrate safe and efficient consumption of least restrictive diet with use of strategies as needed.    Swallow Study Prior Functional Status   Lives at home with family, typically feeds self    General Date of Onset: 04/14/15 Other Pertinent Information: This is a 81 year old lady, who is somewhat drowsy and is unable to give me any clear history.  The daughter is at the bedside and tells me that the patient has been living with her for the last 2 months since the daughter sister died 2 months ago. Over the last 2 months, the patient has become less mobile and has been having physical therapy at home. She had been walking with a walker in the last week but in the last few days has been more or less bedbound, sleepy, poor by mouth intake and less communicative in the last 3-4  days. The patient was diagnosed with UTI proximal knee one month ago and placed on anti-biotic. The daughter describes bedsores that of worsened and causing increasing pain in the last few days. Evaluation in the emergency room shows the patient to have decubitus ulcer with foul-smelling discharge. She is now being admitted for further management. Type of Study: Bedside swallow evaluation Diet Prior to this Study: NPO Temperature Spikes Noted: No Respiratory Status: Room air History of Recent Intubation: No Behavior/Cognition: Alert;Cooperative;Pleasant mood;Requires cueing Oral Cavity - Dentition: Adequate natural dentition/normal for age Self-Feeding Abilities: Needs assist (typically feeds self PTA) Patient Positioning: Other (comment) (up to 40 degrees due to decub) Baseline Vocal Quality: Normal Volitional Cough: Weak Volitional Swallow: Able to elicit    Oral/Motor/Sensory Function Overall Oral Motor/Sensory Function: Appears within functional limits for tasks assessed Velum:  (reduced) Mandible: Within Functional Limits   Ice Chips Ice chips: Within functional limits Presentation: Spoon   Thin Liquid Thin Liquid: Impaired Presentation: Cup Oral Phase Impairments: Reduced labial seal Oral Phase Functional Implications: Right anterior spillage;Left anterior spillage Pharyngeal  Phase Impairments: Suspected delayed Swallow;Cough - Delayed    Nectar Thick Nectar Thick Liquid: Within functional limits Presentation: Cup;Straw   Honey Thick Honey Thick Liquid: Not tested   Puree Puree: Not tested   Solid   Thank you,  Havery Moros, CCC-SLP [phone]     Solid: Impaired Oral Phase Impairments:  (prolonged oral phase)       [name] 04/15/2015,5:18 PM

## 2015-04-16 ENCOUNTER — Encounter (HOSPITAL_COMMUNITY): Payer: Self-pay | Admitting: Internal Medicine

## 2015-04-16 DIAGNOSIS — R739 Hyperglycemia, unspecified: Secondary | ICD-10-CM

## 2015-04-16 DIAGNOSIS — N182 Chronic kidney disease, stage 2 (mild): Secondary | ICD-10-CM

## 2015-04-16 LAB — CBC
HEMATOCRIT: 30.9 % — AB (ref 36.0–46.0)
Hemoglobin: 10.3 g/dL — ABNORMAL LOW (ref 12.0–15.0)
MCH: 29.3 pg (ref 26.0–34.0)
MCHC: 33.3 g/dL (ref 30.0–36.0)
MCV: 87.8 fL (ref 78.0–100.0)
PLATELETS: 254 10*3/uL (ref 150–400)
RBC: 3.52 MIL/uL — AB (ref 3.87–5.11)
RDW: 14.8 % (ref 11.5–15.5)
WBC: 13.7 10*3/uL — ABNORMAL HIGH (ref 4.0–10.5)

## 2015-04-16 LAB — COMPREHENSIVE METABOLIC PANEL
ALBUMIN: 1.9 g/dL — AB (ref 3.5–5.0)
ALT: 14 U/L (ref 14–54)
AST: 29 U/L (ref 15–41)
Alkaline Phosphatase: 58 U/L (ref 38–126)
Anion gap: 7 (ref 5–15)
BUN: 19 mg/dL (ref 6–20)
CALCIUM: 8.9 mg/dL (ref 8.9–10.3)
CO2: 24 mmol/L (ref 22–32)
Chloride: 105 mmol/L (ref 101–111)
Creatinine, Ser: 1.3 mg/dL — ABNORMAL HIGH (ref 0.44–1.00)
GFR calc non Af Amer: 33 mL/min — ABNORMAL LOW (ref >60)
GFR, EST AFRICAN AMERICAN: 39 mL/min — AB (ref >60)
GLUCOSE: 107 mg/dL — AB (ref 65–99)
Potassium: 3.6 mmol/L (ref 3.5–5.1)
Sodium: 136 mmol/L (ref 135–145)
Total Bilirubin: 1.5 mg/dL — ABNORMAL HIGH (ref 0.3–1.2)
Total Protein: 5.3 g/dL — ABNORMAL LOW (ref 6.5–8.1)

## 2015-04-16 LAB — HEMOGLOBIN A1C
Hgb A1c MFr Bld: 6.8 % — ABNORMAL HIGH (ref 4.8–5.6)
Mean Plasma Glucose: 148 mg/dL

## 2015-04-16 LAB — URINE CULTURE: Culture: NO GROWTH

## 2015-04-16 MED ORDER — PRO-STAT 64 PO LIQD
30.0000 mL | Freq: Two times a day (BID) | ORAL | Status: DC
  Administered 2015-04-16 – 2015-04-18 (×2): 30 mL via ORAL
  Filled 2015-04-16 (×4): qty 30

## 2015-04-16 MED ORDER — APIXABAN 5 MG PO TABS: 2.5000 mg | ORAL_TABLET | Freq: Two times a day (BID) | ORAL | Status: DC

## 2015-04-16 MED ORDER — SODIUM CHLORIDE 0.9 % IV SOLN: INTRAVENOUS | Status: AC

## 2015-04-16 NOTE — Progress Notes (Signed)
Per daughter patient was not able to swallow, this writer crushed patient's meds in apple sauce, pt HOB up 45 degrees swallowed her meds in apple sauce without problems, no coughing noted.

## 2015-04-16 NOTE — Progress Notes (Signed)
[facility] PROGRESS NOTE  Melanie Byrd [name] DOB: 09/10/1916 DOA: 04/14/2015 PCP: Melanie Byrd  Assessment/Plan: Acute encephalopathy:  Continues to slowly improve.  Likely related to infectious process and mild dehydration and pain meds. CT of the head without acute intracranial abnormality. Continue gentle IV fluids and anabiotic for her osteomyelitis. Max temp 98.9. Cultures  pending. She remains hemodynamically stable and not hypoxic. Will continue IV fluids gently and vanc and zosyn day #3.  Pressure ulcer, unstageable, with eschar:  MRI indicates osteomyelitis. Continue Vanco and Zosyn as noted above. Appreciate WOC inputs. await surgical consult. On eliquis for hx afib. Follow recommendations for dressing changes.   Osteomyelitis/ Myositis: Per MRI. See #2. Vanc and Zosyn day 3. Blood cultures pending. See above  CKD. Stage III. Stable.   Questionable dysphagia. Evaluated by speech therapy who opine mild risk for aspiration given weak cough and eating in slight declined position. Recommended D2/chopped and nectar thick liquids. Will need encouragement and assist   Dehydration: Due to decreased by mouth intake. Gentle IV fluids. Monitor closely   Hypertension: Controlled. Home medications include Norvasc metoprolol. Norvasc on hold. Continue metoprolol   CHF (congestive heart failure): History diastolic dysfunction. Currently no  signs symptoms of overload. Weight stable. Monitor     Code Status: DNR Family Communication: Daughter at bedside Disposition Plan: home when ready   Consultants:  General surgery  Procedures:  none  Antibiotics:  Vancomycin 04/14/15>>  Zosyn 04/14/15>>   HPI/Subjective: More alert this am. Reports sacral pain.   Objective: Filed Vitals:   04/16/15 0535  BP: 141/77  Pulse: 88  Temp: 98.2 F (36.8 C)  Resp: 20    Intake/Output Summary (Last 24 hours) at 04/16/15 0828 Last data filed at 04/16/15 0251   Gross per 24 hour  Intake    500 ml  Output      0 ml  Net    500 ml   Filed Weights   04/14/15 1156 04/14/15 1848  Weight: 49.896 kg (110 lb) 51.665 kg (113 lb 14.4 oz)    Exam:   General:  Somewhat frail appearing facial grimace with repositioning  Cardiovascular: irregularly irregular +murmur no Melanie Byrd  Respiratory: normal effort BS distant but clear no crackles or wheeze  Abdomen: non-distended non-tender  Musculoskeletal: no clubbing or cyanosis   Data Reviewed: Basic Metabolic Panel:  Recent Labs Lab 04/14/15 1309 04/15/15 0628 04/16/15 0652  NA 133* 134* 136  K 4.1 3.5 3.6  CL 96* 103 105  CO2 26 23 24   GLUCOSE 132* 122* 107*  BUN 23* 18 19  CREATININE 1.52* 1.17* 1.30*  CALCIUM 9.7 8.4* 8.9   Liver Function Tests:  Recent Labs Lab 04/14/15 1309 04/15/15 0628 04/16/15 0652  AST 35 26 29  ALT 14 11* 14  ALKPHOS 76 56 58  BILITOT 1.2 1.3* 1.5*  PROT 6.9 5.2* 5.3*  ALBUMIN 2.7* 1.9* 1.9*   No results for input(s): LIPASE, AMYLASE in the last 168 hours. No results for input(s): AMMONIA in the last 168 hours. CBC:  Recent Labs Lab 04/14/15 1309 04/15/15 0628 04/16/15 0652  WBC 18.5* 14.9* 13.7*  NEUTROABS 15.6*  --   --   HGB 14.1 10.5* 10.3*  HCT 42.2 31.3* 30.9*  MCV 87.9 86.9 87.8  PLT 240 239 254   Cardiac Enzymes:  Recent Labs Lab 04/14/15 1309  TROPONINI 0.07*   BNP (last 3 results) No results for input(s): BNP in the last 8760 hours.  ProBNP (last 3 results)  No results for input(s): PROBNP in the last 8760 hours.  CBG: No results for input(s): GLUCAP in the last 168 hours.  Recent Results (from the past 240 hour(s))  Urine culture     Status: None   Collection Time: 04/14/15  1:30 PM  Result Value Ref Range Status   Specimen Description URINE, CATHETERIZED  Final   Special Requests NONE  Final   Culture   Final    NO GROWTH 2 DAYS Performed at [facility]    Report Status 04/16/2015 FINAL  Final      Studies: Ct Head Wo Contrast  04/14/2015   CLINICAL DATA:  81 year old with weakness.  EXAM: CT HEAD WITHOUT CONTRAST  TECHNIQUE: Contiguous axial images were obtained from the base of the skull through the vertex without contrast.  COMPARISON:  03/08/2015  FINDINGS: Stable cerebral atrophy. Stable low-density in the periventricular white matter. No evidence for acute hemorrhage, mass lesion, midline shift, hydrocephalus or large infarct. No acute bone abnormality.  IMPRESSION: No acute intracranial abnormality.  Stable atrophy and white matter changes.   Electronically Signed   By: Melanie Overlie M.D.   On: 04/14/2015 14:20   Mr Sacrum/si Joints Wo Contrast  04/15/2015   CLINICAL DATA:  Decubitus ulcer.  Osteomyelitis of the sacrum.  EXAM: MR SACRUM WITHOUT CONTRAST  TECHNIQUE: Multiplanar, multisequence MR imaging was performed. No intravenous contrast was administered.  COMPARISON:  None.  FINDINGS: The study is degraded by motion artifact.Coccygeal osteomyelitis is present with bone marrow Byrd in the proximal coccygeal segments. There is a deep decubitus ulcer that extends from the RIGHT side of the gluteal cleft to the surface of the coccyx. There is loss of the normal posterior coccygeal cortex. These findings are best seen on the sagittal images. Phlegmon is present in the dorsal sacral soft tissues, representing a combination of subcutaneous Byrd and likely infectious myositis in the gluteus maximus muscles. There is a left-sided bladder diverticulum incidentally noted on the visible portions of the visceral pelvis.  IMPRESSION: Coccygeal osteomyelitis with deep decubitus ulcer extending to the dorsal coccygeal surface. Surrounding cellulitis a on infectious myositis without an abscess.   Electronically Signed   By: Melanie Newport M.D.   On: 04/15/2015 10:52   Dg Chest Port 1 View  04/14/2015   CLINICAL DATA:  Weakness.  Encephalopathy.  EXAM: PORTABLE CHEST - 1 VIEW  COMPARISON:  03/08/2015   FINDINGS: Patient rotated right. The Chin overlies the right apex. Normal heart size. No pleural effusion or pneumothorax. Low lung volumes with resultant pulmonary interstitial prominence. Mild bibasilar volume loss with probable atelectasis.  IMPRESSION: Bibasilar volume loss with patchy opacities, favored to represent atelectasis.   Electronically Signed   By: Jeronimo Greaves M.D.   On: 04/14/2015 14:07    Scheduled Meds: . amLODipine  5 mg Oral Daily  . antiseptic oral rinse  7 mL Mouth Rinse BID  . apixaban  2.5 mg Oral BID  . cholecalciferol  1,000 Units Oral q morning - 10a  . collagenase   Topical Daily  . docusate sodium  100 mg Oral BID  . escitalopram  10 mg Oral Daily  . feeding supplement (PRO-STAT 64)  30 mL Oral BID  . folic acid  1 mg Oral q morning - 10a  . metoprolol succinate  50 mg Oral QHS  . piperacillin-tazobactam (ZOSYN)  IV  3.375 g Intravenous Q8H  . traMADol  50 mg Oral QHS  . vancomycin  500 mg Intravenous  Q24H   Continuous Infusions:   Principal Problem:   Acute encephalopathy Active Problems:   Dehydration   Hypertension   CHF (congestive heart failure)   Pressure ulcer, unstageable, with eschar   Osteomyelitis   Myositis   CKD (chronic kidney disease), stage II   Hyperglycemia    Time spent: 30 minutes    [facility] M  [facility] Pager [phone]. If 7PM-7AM, please contact night-coverage at [email or web address], password [facility] 04/16/2015, 8:28 AM  LOS: 2 days    Agree with above.  81 yo female with osteomyelitis, CKD 3, HTN, CHF, on Eliquis, stopped and had debridement, IV antibiotics.  She is stable, DNR code status, and lives at home with family.  When better, she will return to home with [facility] RN.

## 2015-04-16 NOTE — Progress Notes (Signed)
PT Cancellation Note  Patient Details Name: Melanie CruzMaggie Byrd MRN: [id number] DOB: 09/23/1916   Cancelled Treatment:    Reason Eval/Treat Not Completed: Other (comment). Pt c new Osteomyolitis findings yesterday, pending surgical consult today. Will hold PT evaluation until later date/time.    Buccola,Allan C 04/16/2015, 10:42 AM 10:42 AM  Rosamaria LintsAllan C Buccola, PT, DPT [place] License # [id number]

## 2015-04-16 NOTE — Clinical Documentation Improvement (Signed)
Query #1 "Acute encephalopathy likely related to infectious process and mild dehydration" is documented in the current medical record.  The patient's mental status has improved with the administration of IV fluids and antibiotics.  For greater specificity, please document the Type of encephalopathy in the progress notes and discharge summary:   - Toxic  - Metabolic  - Other type  - Unable to clinically determine  Thank You, Jerral Ralphathy R Aulton Routt ,RN Clinical Documentation Specialist:  [phone] [facility]- Health Information Management

## 2015-04-16 NOTE — Progress Notes (Deleted)
PT Cancellation Note  Patient Details Name: Melanie Byrd MRN: [id number] DOB: 07/19/1916   Cancelled Treatment:    Reason Eval/Treat Not Completed: Patient's level of consciousness. Chart reviewed, RN consulted. Holding pt treatment at this time due to patient appearing drowsy and incoherent. Pt is dysarthric, mumbling, and not following simple one step commands at this time. PT/Case management is still trying to assess baseline mental status as well as PLOF in mobility. Will attempt at later date/time.     Buccola,Allan C 04/16/2015, 10:34 AM  10:36 AM  Rosamaria LintsAllan C Buccola, PT, DPT [place] License # [id number]

## 2015-04-16 NOTE — Care Management Note (Signed)
Case Management Note  Patient Details  Name: Melanie Byrd MRN: [id number] Date of Birth: 10/15/1915  Subjective/Objective:                    Action/Plan:   Expected Discharge Date:                  Expected Discharge Plan:  Home w Home Health Services  In-House Referral:  NA  Discharge planning Services  CM Consult  Post Acute Care Choice:  Resumption of Svcs/PTA Provider Choice offered to:  Adult Children  DME Arranged:  Hospital bed (hoyer lift) DME Agency:     HH Arranged:  RN HH [facility]  Status of Service:  Completed, signed off  Medicare Important Message Given:    Date Medicare IM Given:    Medicare IM give by:    Date Additional Medicare IM Given:    Additional Medicare Important Message give by:     If discussed at [place] Length of Stay Meetings, dates discussed:    Additional Comments: Pts [facility] and [facility]. [facility] and she will collect pts information from the chart. [facility] will coordinate with family on delivery prior to discharge. Will continue to follow for discharge planning needs. Arlyss QueenBlackwell, [name] Lonetreerowder, RN 04/16/2015, 1:26 PM

## 2015-04-16 NOTE — Care Management Note (Signed)
Case Management Note  Patient Details  Name: Betti CruzMaggie Harbaugh MRN: [id number] Date of Birth: 06/29/1916  Subjective/Objective:                  Pt admitted from home with encephalopathy and osteomylitis. Pt lives with her daughter and will return home at discharge. Pt is bedbound at this time. Pt has a BSC for home use. [facility]  Action/Plan: Pt may need hospital bed and hoyer lift at discharge. Pts [facility] but would like them to step visits up to three times a week. Will continue to follow for discharge planning needs.  Expected Discharge Date:                  Expected Discharge Plan:  Home w Home Health Services  In-House Referral:  NA  Discharge planning Services  CM Consult  Post Acute Care Choice:  Resumption of Svcs/PTA Provider Choice offered to:  Adult Children  DME Arranged:    DME Agency:     HH Arranged:  RN HH [facility]  Status of Service:  In process, will continue to follow  Medicare Important Message Given:    Date Medicare IM Given:    Medicare IM give by:    Date Additional Medicare IM Given:    Additional Medicare Important Message give by:     If discussed at [place] Length of Stay Meetings, dates discussed:    Additional Comments:  Cheryl FlashBlackwell, Romir Klimowicz Crowder, RN 04/16/2015, 11:03 AM

## 2015-04-16 NOTE — Progress Notes (Signed)
Patient incontinent of urine, adult brief saturated along with cloth pad, bladder scanned  ml and 122 ml, peri care provided, turned and repositioned, daughter at bedside.

## 2015-04-16 NOTE — Progress Notes (Signed)
Patient apparently has been off Eliquis yesterday and today. 2 cm area of full thickness skin necrosis debrided using scissors. I did probe the subcutaneous tissue and no abscess cavity is present. This is full-thickness down to the sacrum. Patient tolerated procedure well. Wound care orders have already been written.

## 2015-04-16 NOTE — Progress Notes (Signed)
Patient incontinent of urine, adult brief saturated, peri care provided, bladder scanned @ 112 ml.

## 2015-04-17 LAB — VANCOMYCIN, TROUGH: Vancomycin Tr: 11 ug/mL (ref 10.0–20.0)

## 2015-04-17 LAB — CBC
HCT: 32.1 % — ABNORMAL LOW (ref 36.0–46.0)
HEMOGLOBIN: 10.6 g/dL — AB (ref 12.0–15.0)
MCH: 29.2 pg (ref 26.0–34.0)
MCHC: 33 g/dL (ref 30.0–36.0)
MCV: 88.4 fL (ref 78.0–100.0)
Platelets: 292 10*3/uL (ref 150–400)
RBC: 3.63 MIL/uL — ABNORMAL LOW (ref 3.87–5.11)
RDW: 14.9 % (ref 11.5–15.5)
WBC: 15.4 10*3/uL — ABNORMAL HIGH (ref 4.0–10.5)

## 2015-04-17 LAB — BASIC METABOLIC PANEL
Anion gap: 10 (ref 5–15)
BUN: 23 mg/dL — AB (ref 6–20)
CO2: 22 mmol/L (ref 22–32)
Calcium: 9.3 mg/dL (ref 8.9–10.3)
Chloride: 108 mmol/L (ref 101–111)
Creatinine, Ser: 1.54 mg/dL — ABNORMAL HIGH (ref 0.44–1.00)
GFR calc Af Amer: 31 mL/min — ABNORMAL LOW (ref >60)
GFR, EST NON AFRICAN AMERICAN: 27 mL/min — AB (ref >60)
Glucose, Bld: 144 mg/dL — ABNORMAL HIGH (ref 65–99)
Potassium: 3.4 mmol/L — ABNORMAL LOW (ref 3.5–5.1)
SODIUM: 140 mmol/L (ref 135–145)

## 2015-04-17 LAB — MAGNESIUM: Magnesium: 2 mg/dL (ref 1.7–2.4)

## 2015-04-17 MED ORDER — ACETAMINOPHEN 325 MG PO TABS
650.0000 mg | ORAL_TABLET | Freq: Four times a day (QID) | ORAL | Status: DC | PRN
  Administered 2015-04-18: 650 mg via ORAL
  Filled 2015-04-17 (×2): qty 2

## 2015-04-17 MED ORDER — MILK AND MOLASSES ENEMA
1.0000 | Freq: Once | RECTAL | Status: AC
Start: 2015-04-17 — End: 2015-04-17
  Administered 2015-04-17: 250 mL via RECTAL

## 2015-04-17 MED ORDER — POTASSIUM CHLORIDE CRYS ER 20 MEQ PO TBCR
40.0000 meq | EXTENDED_RELEASE_TABLET | Freq: Once | ORAL | Status: DC
  Filled 2015-04-17: qty 2

## 2015-04-17 MED ORDER — VANCOMYCIN HCL IN DEXTROSE 750-5 MG/150ML-% IV SOLN
750.0000 mg | INTRAVENOUS | Status: DC
  Administered 2015-04-18: 750 mg via INTRAVENOUS
  Filled 2015-04-17 (×4): qty 150

## 2015-04-17 MED ORDER — POTASSIUM CHLORIDE IN NACL 20-0.45 MEQ/L-% IV SOLN
INTRAVENOUS | Status: AC
  Administered 2015-04-17: 16:00:00 via INTRAVENOUS
  Filled 2015-04-17: qty 1000

## 2015-04-17 NOTE — Consult Note (Signed)
WOC wound follow up Wound type:Chronic sacral ulcer, seen by surgery.  Sharps debridement at the bedside by surgery.  Measurement: 3 cm x 2 cm x 2.4 cm  Wound bed: Bone directly palpable.  Pale pink nongranulating wound bed. Bleeds with assessment/cleaning.  Drainage (amount, consistency, odor) Moderate serosanguinous drainage.  Periwound:Intact  Erythema present Dressing procedure/placement/frequency:Will continue Santyl at this time. Anticipate discharge soon once swallowing is evaluated.  Will not follow at this time.  Please re-consult if needed.  Maple HudsonKaren Shifra Swartzentruber RN BSN [facility] Pager [phone]

## 2015-04-17 NOTE — Progress Notes (Signed)
Pt given milk and molasses enema per NP order. Pt had good results post enema, three BM's noted.  Pt tolerated well.  Will continue to monitor.

## 2015-04-17 NOTE — Progress Notes (Signed)
Pt holding meds with applesauce in her mouth.  Refusing to swallow.  Follows simple commands, however.  Emotional support given to daughter at bedside.  Toya SmothersKaren Black, NP called and made aware.  Ordered to keep pt NPO until ST sees pt around lunch time.  Daughter updated on plan of care.

## 2015-04-17 NOTE — Progress Notes (Signed)
[facility] PROGRESS NOTE  Melanie Byrd [id number] DOB: 06/04/1916 DOA: 04/14/2015 PCP: Craig StaggersVASIREDDY,SABITHA, MD  Assessment/Plan: Acute encephalopathy: more alert but less cooperative today. Likely related to infectious process and mild dehydration and pain meds. CT of the head without acute intracranial abnormality. She is afebrile. BP stable mildly tachycardic.  Will continnue gentle IV fluids and antibiotic for her osteomyelitis.  Blood cultures with no growth to date. Will continue IV fluids gently and vanc and zosyn day #4.  Pressure ulcer, unstageable, with eschar:MRI indicates osteomyelitis. S/P bedside debridement per general surgery 04/16/15. Vanco and Zosyn as noted above. Appreciate WOC inputs.  On eliquis for hx afib. Follow recommendations for dressing changes.   Osteomyelitis/ Myositis: Per MRI. See #2. Vanc and Zosyn day 3. Blood cultures pending. See above  CKD. Stage III. Stable.   Questionable dysphagia. Evaluated by speech therapy who opine mild risk for aspiration given weak cough and eating in slight declined position. Recommended D2/chopped and nectar thick liquids. Will need encouragement and assist   Dehydration: improved. Gentle IV fluids. Monitor closely   Hypertension: Controlled. Home medications include Norvasc metoprolol. Norvasc on hold. Continue metoprolol   CHF (congestive heart failure): History diastolic dysfunction. Currently nosigns symptoms of overload. Weight stable. Monitor   Code Status: DNR Family Communication: daughter at bedside Disposition Plan: home when ready hopefully 24 hours   Consultants:  General surgery  WOC  Procedures:  Bedside debridement 04/16/15  Antibiotics:  Vancomycin 04/14/15>>  Zosyn 04/14/15>>  HPI/Subjective: Lying in bed eyes closed. Responds to verbal stimuli. Refuses to swallow  Objective: Filed Vitals:   04/17/15 1031  BP:   Pulse:   Temp: 99.3 F (37.4 C)  Resp:     Intake/Output  Summary (Last 24 hours) at 04/17/15 1245 Last data filed at 04/17/15 1030  Gross per 24 hour  Intake 655.83 ml  Output      0 ml  Net 655.83 ml   Filed Weights   04/14/15 1156 04/14/15 1848  Weight: 49.896 kg (110 lb) 51.665 kg (113 lb 14.4 oz)    Exam:   General:  Well nourished   Cardiovascular: irregularly irregular + murmur  Respiratory: normal effort somewhat shallow BS clear  Abdomen: soft non-distended +BS but sluggish non-tender  Musculoskeletal: no clubbing no cyanosis   Data Reviewed: Basic Metabolic Panel:  Recent Labs Lab 04/14/15 1309 04/15/15 0628 04/16/15 0652 04/17/15 0638  NA 133* 134* 136 140  K 4.1 3.5 3.6 3.4*  CL 96* 103 105 108  CO2 26 23 24 22   GLUCOSE 132* 122* 107* 144*  BUN 23* 18 19 23*  CREATININE 1.52* 1.17* 1.30* 1.54*  CALCIUM 9.7 8.4* 8.9 9.3   Liver Function Tests:  Recent Labs Lab 04/14/15 1309 04/15/15 0628 04/16/15 0652  AST 35 26 29  ALT 14 11* 14  ALKPHOS 76 56 58  BILITOT 1.2 1.3* 1.5*  PROT 6.9 5.2* 5.3*  ALBUMIN 2.7* 1.9* 1.9*   No results for input(s): LIPASE, AMYLASE in the last 168 hours. No results for input(s): AMMONIA in the last 168 hours. CBC:  Recent Labs Lab 04/14/15 1309 04/15/15 0628 04/16/15 0652 04/17/15 0638  WBC 18.5* 14.9* 13.7* 15.4*  NEUTROABS 15.6*  --   --   --   HGB 14.1 10.5* 10.3* 10.6*  HCT 42.2 31.3* 30.9* 32.1*  MCV 87.9 86.9 87.8 88.4  PLT 240 239 254 292   Cardiac Enzymes:  Recent Labs Lab 04/14/15 1309  TROPONINI 0.07*   BNP (last 3 results)  No results for input(s): BNP in the last 8760 hours.  ProBNP (last 3 results) No results for input(s): PROBNP in the last 8760 hours.  CBG: No results for input(s): GLUCAP in the last 168 hours.  Recent Results (from the past 240 hour(s))  Urine culture     Status: None   Collection Time: 04/14/15  1:30 PM  Result Value Ref Range Status   Specimen Description URINE, CATHETERIZED  Final   Special Requests NONE  Final    Culture   Final    NO GROWTH 2 DAYS Performed at [facility]    Report Status 04/16/2015 FINAL  Final  Culture, blood (routine x 2)     Status: None (Preliminary result)   Collection Time: 04/14/15  1:32 PM  Result Value Ref Range Status   Specimen Description BLOOD RIGHT HAND  Final   Special Requests BOTTLES DRAWN AEROBIC ONLY 8CC  Final   Culture NO GROWTH 3 DAYS  Final   Report Status PENDING  Incomplete  Culture, blood (routine x 2)     Status: None (Preliminary result)   Collection Time: 04/14/15  1:33 PM  Result Value Ref Range Status   Specimen Description BLOOD LEFT HAND  Final   Special Requests   Final    BOTTLES DRAWN AEROBIC AND ANAEROBIC AEB 6CC ANA 6CC   Culture NO GROWTH 3 DAYS  Final   Report Status PENDING  Incomplete     Studies: No results found.  Scheduled Meds: . amLODipine  5 mg Oral Daily  . antiseptic oral rinse  7 mL Mouth Rinse BID  . apixaban  2.5 mg Oral BID  . cholecalciferol  1,000 Units Oral q morning - 10a  . collagenase   Topical Daily  . docusate sodium  100 mg Oral BID  . escitalopram  10 mg Oral Daily  . feeding supplement (PRO-STAT 64)  30 mL Oral BID  . folic acid  1 mg Oral q morning - 10a  . metoprolol succinate  50 mg Oral QHS  . milk and molasses  1 enema Rectal Once  . piperacillin-tazobactam (ZOSYN)  IV  3.375 g Intravenous Q8H  . potassium chloride  40 mEq Oral Once  . traMADol  50 mg Oral QHS  . vancomycin  500 mg Intravenous Q24H   Continuous Infusions:   Principal Problem:   Acute encephalopathy Active Problems:   Dehydration   Hypertension   CHF (congestive heart failure)   Pressure ulcer, unstageable, with eschar   Osteomyelitis   Myositis   CKD (chronic kidney disease), stage II   Hyperglycemia    Time spent: 35 minutes    [facility] M  [facility] Pager [phone]. If 7PM-7AM, please contact night-coverage at [email or web address], password [facility] 04/17/2015, 12:45 PM  LOS: 3 days

## 2015-04-17 NOTE — Progress Notes (Signed)
PT Cancellation Note  Patient Details Name: Melanie CruzMaggie Perrone MRN: [id number] DOB: 09/17/1916   Cancelled Treatment:    Reason Eval/Treat Not Completed: Patient at procedure or test/unavailable. Pt undergoing wound debridement at this time. Will attempt Eval at later date and time.    Hajar Penninger C 04/17/2015, 5:34 PM 5:34 PM  Rosamaria LintsAllan C Tanveer Dobberstein, PT, DPT [place] License # [id number]

## 2015-04-17 NOTE — Progress Notes (Signed)
ANTIBIOTIC CONSULT NOTE  Pharmacy Consult for Vancomycin and Zosyn  Indication: Decubitus Ulcer , PNA  Allergies  Allergen Reactions  . Cephalexin Other (See Comments)    ams   Patient Measurements: Height: 5\' 5"  (165.1 cm) Weight: 113 lb 14.4 oz (51.665 kg) IBW/kg (Calculated) : 57  Vital Signs: Temp: 99.3 F (37.4 C) (07/07 1031) Temp Source: Rectal (07/07 1031) BP: 144/60 mmHg (07/07 1300) Pulse Rate: 82 (07/07 1300) Intake/Output from previous day: 07/06 0701 - 07/07 0700 In: 655.8 [I.V.:505.8; IV Piggyback:150] Out: -  Intake/Output from this shift: Total I/O In: 150 [IV Piggyback:150] Out: -   Labs:  Recent Labs  04/15/15 0628 04/16/15 0652 04/17/15 0638  WBC 14.9* 13.7* 15.4*  HGB 10.5* 10.3* 10.6*  PLT 239 254 292  CREATININE 1.17* 1.30* 1.54*   Estimated Creatinine Clearance: 17 mL/min (by C-G formula based on Cr of 1.54).  Recent Labs  04/17/15 1455  VANCOTROUGH 11    Microbiology: Recent Results (from the past 720 hour(s))  Urine culture     Status: None   Collection Time: 03/28/15  3:09 PM  Result Value Ref Range Status   Specimen Description URINE, CATHETERIZED  Final   Special Requests NONE  Final   Culture   Final    80,000 COLONIES/ml PSEUDOMONAS AERUGINOSA Performed at [facility]    Report Status 03/30/2015 FINAL  Final   Organism ID, Bacteria PSEUDOMONAS AERUGINOSA  Final      Susceptibility   Pseudomonas aeruginosa - MIC*    CEFTAZIDIME 4 SENSITIVE Sensitive     CIPROFLOXACIN <=0.25 SENSITIVE Sensitive     GENTAMICIN 4 SENSITIVE Sensitive     IMIPENEM 1 SENSITIVE Sensitive     PIP/TAZO <=4 SENSITIVE Sensitive     CEFEPIME 2 SENSITIVE Sensitive     * 80,000 COLONIES/ml PSEUDOMONAS AERUGINOSA  Urine culture     Status: None   Collection Time: 04/14/15  1:30 PM  Result Value Ref Range Status   Specimen Description URINE, CATHETERIZED  Final   Special Requests NONE  Final   Culture   Final    NO GROWTH 2  DAYS Performed at [facility]    Report Status 04/16/2015 FINAL  Final  Culture, blood (routine x 2)     Status: None (Preliminary result)   Collection Time: 04/14/15  1:32 PM  Result Value Ref Range Status   Specimen Description BLOOD RIGHT HAND  Final   Special Requests BOTTLES DRAWN AEROBIC ONLY 8CC  Final   Culture NO GROWTH 3 DAYS  Final   Report Status PENDING  Incomplete  Culture, blood (routine x 2)     Status: None (Preliminary result)   Collection Time: 04/14/15  1:33 PM  Result Value Ref Range Status   Specimen Description BLOOD LEFT HAND  Final   Special Requests   Final    BOTTLES DRAWN AEROBIC AND ANAEROBIC AEB 6CC ANA 6CC   Culture NO GROWTH 3 DAYS  Final   Report Status PENDING  Incomplete    Anti-infectives    Start     Dose/Rate Route Frequency Ordered Stop   04/15/15 1800  piperacillin-tazobactam (ZOSYN) IVPB 3.375 g     3.375 g 12.5 mL/hr over 240 Minutes Intravenous Every 8 hours 04/15/15 1047     04/15/15 1600  vancomycin (VANCOCIN) 500 mg in sodium chloride 0.9 % 100 mL IVPB     500 mg 100 mL/hr over 60 Minutes Intravenous Every 24 hours 04/14/15 2200  04/15/15 1000  piperacillin-tazobactam (ZOSYN) 2.25 g in dextrose 5 % 50 mL IVPB  Status:  Discontinued     2.25 g 100 mL/hr over 30 Minutes Intravenous Every 8 hours 04/15/15 0808 04/15/15 1047   04/15/15 0200  piperacillin-tazobactam (ZOSYN) IVPB 2.25 g  Status:  Discontinued     2.25 g 100 mL/hr over 30 Minutes Intravenous Every 8 hours 04/14/15 2200 04/15/15 0808   04/14/15 1715  piperacillin-tazobactam (ZOSYN) IVPB 3.375 g     3.375 g 100 mL/hr over 30 Minutes Intravenous STAT 04/14/15 1709 04/14/15 1807   04/14/15 1600  vancomycin (VANCOCIN) IVPB 1000 mg/200 mL premix     1,000 mg 200 mL/hr over 60 Minutes Intravenous  Once 04/14/15 1559 04/14/15 1724     Assessment: Okay for Protocol, micro pending.  Trough level below goal for indication.  SCr slightly higher than on admission.  MRI  is consistent with osteomyelitis.  Goal of Therapy:  Vancomycin trough level 15-20 mcg/ml  Plan:  Continue Zosyn to 3.375gm IV every 8 hours. Increase Vancomycin to  IV every 24 hours. Monitor SCr 3 times weekly Check Vancomycin trough level weekly or sooner if warranted F/U cultures  Valrie Hart A 04/17/2015,5:08 PM

## 2015-04-18 LAB — BASIC METABOLIC PANEL
Anion gap: 10 (ref 5–15)
BUN: 23 mg/dL — AB (ref 6–20)
CHLORIDE: 111 mmol/L (ref 101–111)
CO2: 22 mmol/L (ref 22–32)
Calcium: 9.5 mg/dL (ref 8.9–10.3)
Creatinine, Ser: 1.39 mg/dL — ABNORMAL HIGH (ref 0.44–1.00)
GFR calc Af Amer: 36 mL/min — ABNORMAL LOW (ref >60)
GFR calc non Af Amer: 31 mL/min — ABNORMAL LOW (ref >60)
Glucose, Bld: 135 mg/dL — ABNORMAL HIGH (ref 65–99)
Potassium: 3.2 mmol/L — ABNORMAL LOW (ref 3.5–5.1)
Sodium: 143 mmol/L (ref 135–145)

## 2015-04-18 LAB — CBC
HEMATOCRIT: 30.4 % — AB (ref 36.0–46.0)
Hemoglobin: 10.3 g/dL — ABNORMAL LOW (ref 12.0–15.0)
MCH: 29.4 pg (ref 26.0–34.0)
MCHC: 33.9 g/dL (ref 30.0–36.0)
MCV: 86.9 fL (ref 78.0–100.0)
Platelets: 322 10*3/uL (ref 150–400)
RBC: 3.5 MIL/uL — ABNORMAL LOW (ref 3.87–5.11)
RDW: 14.8 % (ref 11.5–15.5)
WBC: 12 10*3/uL — ABNORMAL HIGH (ref 4.0–10.5)

## 2015-04-18 MED ORDER — PRO-STAT 64 PO LIQD: 30.0000 mL | Freq: Two times a day (BID) | ORAL | Status: AC

## 2015-04-18 MED ORDER — CIPROFLOXACIN HCL 500 MG PO TABS: 500.0000 mg | ORAL_TABLET | Freq: Two times a day (BID) | ORAL | Status: DC

## 2015-04-18 MED ORDER — COLLAGENASE 250 UNIT/GM EX OINT: TOPICAL_OINTMENT | Freq: Every day | CUTANEOUS | Status: AC

## 2015-04-18 MED ORDER — RESOURCE THICKENUP CLEAR PO POWD: 1.0000 | ORAL | Status: AC | PRN

## 2015-04-18 MED ORDER — ACETAMINOPHEN 650 MG RE SUPP: 650.0000 mg | Freq: Four times a day (QID) | RECTAL | Status: AC | PRN

## 2015-04-18 MED ORDER — POTASSIUM CHLORIDE 10 MEQ/100ML IV SOLN
10.0000 meq | INTRAVENOUS | Status: AC
  Administered 2015-04-18 (×2): 10 meq via INTRAVENOUS
  Filled 2015-04-18 (×2): qty 100

## 2015-04-18 NOTE — Progress Notes (Signed)
Vitals taken, medical necessity completed, patient moved to stretcher by EMS.  Left facility via stretcher with EMS accompanied by daughter with ordered medical equipment delivered to home prior to discharge including hospital bed and hoyer lift.

## 2015-04-18 NOTE — Plan of Care (Signed)
Problem: Discharge Progression Outcomes Goal: Tolerating diet Outcome: Adequate for Discharge At baseline

## 2015-04-18 NOTE — Progress Notes (Addendum)
Speech Language Pathology Treatment: Dysphagia  Patient Details Name: Melanie Byrd MRN: [id number] DOB: 08/20/1916 Today's Date: 04/17/2015 Time: [id number] SLP Time Calculation (min) (ACUTE ONLY): 60 min  Assessment / Plan / Recommendation Clinical Impression  Pt seen in room with daughter and granddaughters present. Pt more lethargic today and requires max cues to open her eyes. Upon SLP arrival, room was dark and shades were drawn. SLP encouraged her daughter to open shades and turn on light to help orient pt to day/night. She responded appropriately to questions intermittently and attempted to follow clinician's requests. Pt agreeable to po, but stated "I'm not hungry".   Pt readily accepted cup sips of NTL apple juice (SLP placed in her right hand and guided to her mouth), however she took a large sip and held in her mouth for several minutes despite max cues to swallow. SLP attempted maneuvers to trigger swallow (tactile cues, dry spoon to lips, cues to move lips and tongue, encourage verbal response), however unsuccessful. Pt finally expectorated liquid. Pt responded best with ice chips and ice cream, likely due to thermal response. Pt encouraged to masticate ice chips, which she did, and swallow was prompt and without incident. Pt demonstrated po readiness skills of opening mouth to ice cream to lips and closing lips around spoon. She again was encouraged to "chew" ice cream to help volitional stages of swallow trigger more automatic responses which helps in a cognitive based dysphagia.   Pt's current dysphagia is largely cognitively based which puts her at risk for malnutrition and aspiration. Feeding tubes are not recommended in this setting (advanced age with cognitive decline). Daughter expressed to SLP that "she was fine before all this happened... Nothing wrong with her memory" and I acknowledged her accounts and reinforced that her advanced age, being out of her typical routine, pain and pain  medications, and osteomyelitis created a "perfect storm" and negatively impacted her swallow. SLP has concerns that family will not be able to adequately care for pt at home in this state. SLP asked daughter to help reposition pt in bed and she stated that she was unable (and listed several ailments). Pt may be appropriate for hospice consult to facilitate additional family education and support with decisions. It is possible that if alertness improves (now off of morphine), pt will eat/drink more. Recommend continued diet order with a focus on NTL, ice cream ok, try Magic Cups as pt responded best to cold temperature. OK for ice chips. Continue oral care. Intake will likely be minimal until alertness improves. Above to daughter, RN, MD, and NP.   HPI Other Pertinent Information: This is a 81 year old lady, who is somewhat drowsy and is unable to give me any clear history. The daughter is at the bedside and tells me that the patient has been living with her for the last 2 months since the daughter sister died 2 months ago. Over the last 2 months, the patient has become less mobile and has been having physical therapy at home. She had been walking with a walker in the last week but in the last few days has been more or less bedbound, sleepy, poor by mouth intake and less communicative in the last 3-4 days. The patient was diagnosed with UTI proximal knee one month ago and placed on anti-biotic. The daughter describes bedsores that of worsened and causing increasing pain in the last few days. Evaluation in the emergency room shows the patient to have decubitus ulcer with foul-smelling discharge. She is now  being admitted for further management.   Pertinent Vitals Pain Assessment: No/denies pain  SLP Plan  Continue with current plan of care    Recommendations Diet recommendations: Dysphagia 2 (fine chop);Nectar-thick liquid (po only when alert) Liquids provided via: Cup Medication Administration: Other  (Comment) (try crushed in ice cream) Supervision: Full supervision/cueing for compensatory strategies;Staff to assist with self feeding;Trained caregiver to feed patient Compensations: Slow rate;Small sips/bites Postural Changes and/or Swallow Maneuvers: Upright 30-60 min after meal              Oral Care Recommendations: Oral care BID;Staff/trained caregiver to provide oral care Follow up Recommendations: Home health SLP;Skilled Nursing facility Plan: Continue with current plan of care        Thank you,  Havery Moros, CCC-SLP [phone]  Jasmon Mattice 04/17/2015, 5:31 PM

## 2015-04-18 NOTE — Care Management Note (Signed)
Case Management Note  Patient Details  Name: Melanie Byrd MRN: [id number] Date of Birth: 08/11/1916  Subjective/Objective:                    Action/Plan:   Expected Discharge Date:                  Expected Discharge Plan:  Home w Home Health Services  In-House Referral:  NA  Discharge planning Services  CM Consult  Post Acute Care Choice:  Home Health Choice offered to:  Adult Children  DME Arranged:  Hospital bed (hoyer lift) DME Agency:     HH Arranged:  RN, PT, Speech Therapy HH Agency:  [facility]  Status of Service:  Completed, signed off  Medicare Important Message Given:    Date Medicare IM Given:    Medicare IM give by:    Date Additional Medicare IM Given:    Additional Medicare Important Message give by:     If discussed at [place] Length of Stay Meetings, dates discussed:    Additional Comments: Pt discharging home today with [facility] RN, PT, ST (per pts daughter choice). Alroy BailiffLinda Lothian of [facility] is aware and will collect the pts information from the chart. HH services to start within 24 hours of discharge. Hospital bed and hoyer lift with [facility] will be delivered to pts home prior to discharge. Pts daughter and pts nurse aware of discharge arrangements. Arlyss QueenBlackwell, Josafat Enrico [place], RN 04/18/2015, 11:26 AM

## 2015-04-18 NOTE — Care Management Note (Signed)
Case Management Note  Patient Details  Name: Melanie CruzMaggie Volkert MRN: [id number] Date of Birth: 06/14/1916  Subjective/Objective:                    Action/Plan:   Expected Discharge Date:                  Expected Discharge Plan:  Home w Home Health Services  In-House Referral:  NA  Discharge planning Services  CM Consult  Post Acute Care Choice:  Home Health Choice offered to:  Adult Children  DME Arranged:  Hospital bed, Wheelchair manual (hoyer lift) DME Agency:     HH Arranged:  RN, PT, Speech Therapy HH Agency:  [facility]  Status of Service:  Completed, signed off  Medicare Important Message Given:    Date Medicare IM Given:    Medicare IM give by:    Date Additional Medicare IM Given:    Additional Medicare Important Message give by:     If discussed at [place] Length of Stay Meetings, dates discussed:    Additional Comments: Pts daughter is requesting [facility] W/C will be delivered to pts home with other DME. Arlyss QueenBlackwell, Mekiyah [place], RN 04/18/2015, 1:11 PM

## 2015-04-18 NOTE — Discharge Summary (Signed)
Physician Discharge Summary  Melanie Byrd ZOX:[id number] DOB: 04-04-1916 DOA: 04/14/2015  PCP: Craig Staggers, MD  Admit date: 04/14/2015 Discharge date: 04/18/2015  Time spent: 40 minutes  Recommendations for Outpatient Follow-up:  1. PCP 1-2 weeks for evaluation of electrolytes, functional status, swallowing and wound check 2. HH RN for wound care and observation assessment/ speech therapy   Discharge Diagnoses:  Principal Problem:   Acute encephalopathy Active Problems:   Dehydration   Hypertension   CHF (congestive heart failure)   Pressure ulcer, unstageable, with eschar   Osteomyelitis   Myositis   CKD (chronic kidney disease), stage II   Hyperglycemia   Discharge Condition: stable  Diet recommendation: dysphagia 2 fine chop nectar thick via cup full supervision/cueing upright 30-60 minutes after meal  Filed Weights   04/14/15 1156 04/14/15 1848  Weight: 49.896 kg (110 lb) 51.665 kg (113 lb 14.4 oz)    History of present illness:  This is a 81 year old lady, who was somewhat drowsy and unable to give clear history on day of presentation 04/14/15. The daughter  at the bedside reported that the patient had been living with her for the previous 2 months since the daughter sister died 2 months ago. During that time the patient had become less mobile and had been having physical therapy at home. She had been walking with a walker in the previous week but in the prior days had been more or less bedbound, sleepy, poor by mouth intake and less communicative. The patient was diagnosed with UTI  one month prior and placed on anti-biotic. The daughter described bedsores that of worsened and causing increasing pain over previous few days. Evaluation in the emergency room showed the patient to have decubitus ulcer with foul-smelling discharge.   Hospital Course:  Acute encephalopathy:likely metabolic secondary to infection sacral decub and osteomyelitis in setting of mild dehydration and   narcotics. Much improved at discharge but remains somewhat uncooperative with eating/swallowing. Denies pain/discomfort. CT of the head without acute intracranial abnormality. She has been  Afebrile for 36 hours and WBC 12.0 down from 18.5 on admission. She was provided with vancomycin and zosyn for 4 days. Will be discharged with cipro.  Blood cultures with no growth to date.  Pressure ulcer, unstageable, with eschar:MRI indicates osteomyelitis. S/P bedside debridement per general surgery 04/16/15. Vanco and Zosyn for 4 days. Will be discharge with oral antibiotics as above. Evaluated by  WOC and dressing change recommendations noted. [facility] RN for wound care. Family wishes to take patient home with [facility] assistance   Osteomyelitis/ Myositis: Per MRI. See #2. Vanc and Zosyn as above. Blood cultures no growth. See #1. Family wished to take home with oral antibiotics and HH assistance.   CKD. Stage III. Stable.   Questionable dysphagia. Evaluated by speech therapy who opine pt's current dysphagia is largely cognitively based which puts her at risk for malnutrition and aspiration. Feeding tubes are not recommended in this setting (advanced age with cognitive decline and pt may be appropriate for hospice consult to facilitate additional family education and support with decisions. It is possible that if alertness improves (now off of morphine), pt will eat/drink more. Recommend continued diet order with a focus on NTL, ice cream ok, try Magic Cups as pt responded best to cold temperature. OK for  Recommended D2/chopped and nectar thick liquids. Will need encouragement and assist. HH speech requested   Dehydration: related to decreased oral intake. Will continue to be challenge. Caregivers educated to strategies to encourage patient to  eat/drink. Daughter also counseled regarding possible course and recognizing end of life scenarios.    Hypertension: Controlled. Patients willingness to swallow meds  unreliable   CHF (congestive heart failure): History diastolic dysfunction. compendsated  Procedures:  Bedside debridement 04/16/15   Consultations:  woc  General surgery  Discharge Exam: Filed Vitals:   04/18/15 0500  BP: 135/59  Pulse: 85  Temp: 98.7 F (37.1 C)  Resp: 22    General: somewhat frail appearing. Appears comfortable Cardiovascular: irregularly irregular +murmur no LE edema Respiratory: normal effort somewhat shallow BS distant but clear Neuro: responds to verbal stimuli. Follows simple commands. Will not open eyes when asked or swallow when asked  Discharge Instructions    Current Discharge Medication List    START taking these medications   Details  acetaminophen (TYLENOL) 650 MG suppository Place 1 suppository (650 mg total) rectally every 6 (six) hours as needed for fever or moderate pain (rectal temp >100.5). Qty: 12 suppository, Refills: 0    Amino Acids-Protein Hydrolys (FEEDING SUPPLEMENT, PRO-STAT 64,) LIQD Take 30 mLs by mouth 2 (two) times daily at 10 AM and 5 PM.    ciprofloxacin (CIPRO) 500 MG tablet Take 1 tablet (500 mg total) by mouth 2 (two) times daily. Qty: 14 tablet, Refills: 0    collagenase (SANTYL) ointment Apply topically daily. Qty: 15 g, Refills: 0    Maltodextrin-Xanthan Gum (RESOURCE THICKENUP CLEAR) POWD Take 120 g by mouth as needed (Nectar thick liquids). Qty: 1 Can, Refills: 1      CONTINUE these medications which have NOT CHANGED   Details  amitriptyline (ELAVIL) 10 MG tablet Take 10 mg by mouth at bedtime.    amLODipine (NORVASC) 5 MG tablet Take 5 mg by mouth daily.    apixaban (ELIQUIS) 2.5 MG TABS tablet Take 2.5 mg by mouth 2 (two) times daily.    atorvastatin (LIPITOR) 20 MG tablet Take 20 mg by mouth every evening.    cholecalciferol (VITAMIN D) 1000 UNITS tablet Take 1,000 Units by mouth every morning.    docusate sodium (COLACE) 100 MG capsule Take 100 mg by mouth 2 (two) times daily.     escitalopram (LEXAPRO) 10 MG tablet Take 10 mg by mouth daily.    folic acid ([employer] FOLIC ACID) 800 MCG tablet Take 800 mcg by mouth every morning.    gabapentin (NEURONTIN) 100 MG capsule Take 100 mg by mouth 3 (three) times daily.    metoprolol succinate (TOPROL-XL) 50 MG 24 hr tablet Take 50 mg by mouth at bedtime. Take with or immediately following a meal.    traMADol (ULTRAM) 50 MG tablet Take 50 mg by mouth at bedtime.      STOP taking these medications     azithromycin (ZITHROMAX) 500 MG tablet      cefUROXime (CEFTIN) 500 MG tablet      guaiFENesin (MUCINEX) 600 MG 12 hr tablet      nitrofurantoin, macrocrystal-monohydrate, (MACROBID) 100 MG capsule        Allergies  Allergen Reactions  . Cephalexin Other (See Comments)    ams   Follow-up Information    Follow up with VASIREDDY,SABITHA, MD.   Specialty:  Internal Medicine   Why:  1-2 weeks evaluation of decub and recommend evaluation of potassium level   Contact information:   [address] [place] [phone]        The results of significant diagnostics from this hospitalization (including imaging, microbiology, ancillary and laboratory) are listed below for reference.  Significant Diagnostic Studies: Ct Head Wo Contrast  04/14/2015   CLINICAL DATA:  81 year old with weakness.  EXAM: CT HEAD WITHOUT CONTRAST  TECHNIQUE: Contiguous axial images were obtained from the base of the skull through the vertex without contrast.  COMPARISON:  03/08/2015  FINDINGS: Stable cerebral atrophy. Stable low-density in the periventricular white matter. No evidence for acute hemorrhage, mass lesion, midline shift, hydrocephalus or large infarct. No acute bone abnormality.  IMPRESSION: No acute intracranial abnormality.  Stable atrophy and white matter changes.   Electronically Signed   By: Richarda Overlie M.D.   On: 04/14/2015 14:20   Mr Sacrum/si Joints Wo Contrast  04/15/2015   CLINICAL DATA:  Decubitus ulcer.   Osteomyelitis of the sacrum.  EXAM: MR SACRUM WITHOUT CONTRAST  TECHNIQUE: Multiplanar, multisequence MR imaging was performed. No intravenous contrast was administered.  COMPARISON:  None.  FINDINGS: The study is degraded by motion artifact.Coccygeal osteomyelitis is present with bone marrow edema in the proximal coccygeal segments. There is a deep decubitus ulcer that extends from the RIGHT side of the gluteal cleft to the surface of the coccyx. There is loss of the normal posterior coccygeal cortex. These findings are best seen on the sagittal images. Phlegmon is present in the dorsal sacral soft tissues, representing a combination of subcutaneous edema and likely infectious myositis in the gluteus maximus muscles. There is a left-sided bladder diverticulum incidentally noted on the visible portions of the visceral pelvis.  IMPRESSION: Coccygeal osteomyelitis with deep decubitus ulcer extending to the dorsal coccygeal surface. Surrounding cellulitis a on infectious myositis without an abscess.   Electronically Signed   By: Andreas Newport M.D.   On: 04/15/2015 10:52   Dg Chest Port 1 View  04/14/2015   CLINICAL DATA:  Weakness.  Encephalopathy.  EXAM: PORTABLE CHEST - 1 VIEW  COMPARISON:  03/08/2015  FINDINGS: Patient rotated right. The Chin overlies the right apex. Normal heart size. No pleural effusion or pneumothorax. Low lung volumes with resultant pulmonary interstitial prominence. Mild bibasilar volume loss with probable atelectasis.  IMPRESSION: Bibasilar volume loss with patchy opacities, favored to represent atelectasis.   Electronically Signed   By: Jeronimo Greaves M.D.   On: 04/14/2015 14:07    Microbiology: Recent Results (from the past 240 hour(s))  Urine culture     Status: None   Collection Time: 04/14/15  1:30 PM  Result Value Ref Range Status   Specimen Description URINE, CATHETERIZED  Final   Special Requests NONE  Final   Culture   Final    NO GROWTH 2 DAYS Performed at [facility]    Report Status 04/16/2015 FINAL  Final  Culture, blood (routine x 2)     Status: None (Preliminary result)   Collection Time: 04/14/15  1:32 PM  Result Value Ref Range Status   Specimen Description BLOOD RIGHT HAND  Final   Special Requests BOTTLES DRAWN AEROBIC ONLY 8CC  Final   Culture NO GROWTH 3 DAYS  Final   Report Status PENDING  Incomplete  Culture, blood (routine x 2)     Status: None (Preliminary result)   Collection Time: 04/14/15  1:33 PM  Result Value Ref Range Status   Specimen Description BLOOD LEFT HAND  Final   Special Requests   Final    BOTTLES DRAWN AEROBIC AND ANAEROBIC AEB 6CC ANA 6CC   Culture NO GROWTH 3 DAYS  Final   Report Status PENDING  Incomplete     Labs: Basic Metabolic Panel:  Recent Labs Lab 04/14/15 1309 04/15/15 0628 04/16/15 0652 04/17/15 0638 04/18/15 0628  NA 133* 134* 136 140 143  K 4.1 3.5 3.6 3.4* 3.2*  CL 96* 103 105 108 111  CO2 GLUCOSE 132* 122* 107* 144* 135*  BUN 23* 18 19 23* 23*  CREATININE 1.52* 1.17* 1.30* 1.54* 1.39*  CALCIUM 9.7 8.4* 8.9 9.3 9.5  MG  --   --   --  2.0  --    Liver Function Tests:  Recent Labs Lab 04/14/15 1309 04/15/15 0628 04/16/15 0652  AST 35 26 29  ALT 14 11* 14  ALKPHOS 76 56 58  BILITOT 1.2 1.3* 1.5*  PROT 6.9 5.2* 5.3*  ALBUMIN 2.7* 1.9* 1.9*   No results for input(s): LIPASE, AMYLASE in the last 168 hours. No results for input(s): AMMONIA in the last 168 hours. CBC:  Recent Labs Lab 04/14/15 1309 04/15/15 0628 04/16/15 0652 04/17/15 0638 04/18/15 0628  WBC 18.5* 14.9* 13.7* 15.4* 12.0*  NEUTROABS 15.6*  --   --   --   --   HGB 14.1 10.5* 10.3* 10.6* 10.3*  HCT 42.2 31.3* 30.9* 32.1* 30.4*  MCV 87.9 86.9 87.8 88.4 86.9  PLT 240 239 254 292 322   Cardiac Enzymes:  Recent Labs Lab 04/14/15 1309  TROPONINI 0.07*   BNP: BNP (last 3 results) No results for input(s): BNP in the last 8760 hours.  ProBNP (last 3 results) No results for  input(s): PROBNP in the last 8760 hours.  CBG: No results for input(s): GLUCAP in the last 168 hours.     SignedGwenyth Bender  [facility] 04/18/2015, 9:26 AM

## 2015-04-18 NOTE — Progress Notes (Signed)
PT Cancellation Note  Patient Details Name: Melanie CruzMaggie Byrd MRN: [id number] DOB: 04/26/1916   Cancelled Treatment:    Reason Eval/Treat Not Completed: Other (comment) (Pt daughter refuses evaluation at this time. ) Virgina OrganCynthia Russell, PT CLT [phone] 04/18/2015, 11:42 AM

## 2015-04-18 NOTE — Progress Notes (Signed)
Pt continues to pocket meds given with ice cream

## 2015-04-19 LAB — CULTURE, BLOOD (ROUTINE X 2)
Culture: NO GROWTH
Culture: NO GROWTH

## 2015-04-20 ENCOUNTER — Inpatient Hospital Stay (HOSPITAL_COMMUNITY)
Admission: EM | Admit: 2015-04-20 | Discharge: 2015-04-23 | DRG: 640 | Disposition: A | Discharge status: Hospice | Payer: Medicare Other | Attending: Family Medicine | Admitting: Family Medicine

## 2015-04-20 ENCOUNTER — Emergency Department (HOSPITAL_COMMUNITY): Payer: Medicare Other

## 2015-04-20 ENCOUNTER — Encounter (HOSPITAL_COMMUNITY): Payer: Self-pay | Admitting: *Deleted

## 2015-04-20 DIAGNOSIS — E46 Unspecified protein-calorie malnutrition: Secondary | ICD-10-CM | POA: Diagnosis present

## 2015-04-20 DIAGNOSIS — R011 Cardiac murmur, unspecified: Secondary | ICD-10-CM | POA: Diagnosis not present

## 2015-04-20 DIAGNOSIS — M1990 Unspecified osteoarthritis, unspecified site: Secondary | ICD-10-CM | POA: Diagnosis present

## 2015-04-20 DIAGNOSIS — M4628 Osteomyelitis of vertebra, sacral and sacrococcygeal region: Secondary | ICD-10-CM | POA: Diagnosis present

## 2015-04-20 DIAGNOSIS — Z515 Encounter for palliative care: Secondary | ICD-10-CM

## 2015-04-20 DIAGNOSIS — D649 Anemia, unspecified: Secondary | ICD-10-CM

## 2015-04-20 DIAGNOSIS — N182 Chronic kidney disease, stage 2 (mild): Secondary | ICD-10-CM | POA: Diagnosis present

## 2015-04-20 DIAGNOSIS — Z66 Do not resuscitate: Secondary | ICD-10-CM | POA: Diagnosis present

## 2015-04-20 DIAGNOSIS — R1310 Dysphagia, unspecified: Secondary | ICD-10-CM | POA: Diagnosis present

## 2015-04-20 DIAGNOSIS — I4891 Unspecified atrial fibrillation: Secondary | ICD-10-CM | POA: Diagnosis not present

## 2015-04-20 DIAGNOSIS — R531 Weakness: Secondary | ICD-10-CM

## 2015-04-20 DIAGNOSIS — I129 Hypertensive chronic kidney disease with stage 1 through stage 4 chronic kidney disease, or unspecified chronic kidney disease: Secondary | ICD-10-CM | POA: Diagnosis present

## 2015-04-20 DIAGNOSIS — R739 Hyperglycemia, unspecified: Secondary | ICD-10-CM | POA: Diagnosis present

## 2015-04-20 DIAGNOSIS — E785 Hyperlipidemia, unspecified: Secondary | ICD-10-CM | POA: Diagnosis present

## 2015-04-20 DIAGNOSIS — L98429 Non-pressure chronic ulcer of back with unspecified severity: Secondary | ICD-10-CM | POA: Diagnosis present

## 2015-04-20 DIAGNOSIS — E860 Dehydration: Principal | ICD-10-CM | POA: Diagnosis present

## 2015-04-20 DIAGNOSIS — L89154 Pressure ulcer of sacral region, stage 4: Secondary | ICD-10-CM | POA: Diagnosis present

## 2015-04-20 DIAGNOSIS — F0390 Unspecified dementia without behavioral disturbance: Secondary | ICD-10-CM | POA: Diagnosis present

## 2015-04-20 DIAGNOSIS — R627 Adult failure to thrive: Secondary | ICD-10-CM | POA: Diagnosis present

## 2015-04-20 DIAGNOSIS — Z681 Body mass index (BMI) 19 or less, adult: Secondary | ICD-10-CM

## 2015-04-20 DIAGNOSIS — E780 Pure hypercholesterolemia: Secondary | ICD-10-CM | POA: Diagnosis present

## 2015-04-20 DIAGNOSIS — N183 Chronic kidney disease, stage 3 (moderate): Secondary | ICD-10-CM | POA: Diagnosis present

## 2015-04-20 HISTORY — DX: Atherosclerotic heart disease of native coronary artery without angina pectoris: I25.10

## 2015-04-20 LAB — URINE MICROSCOPIC-ADD ON

## 2015-04-20 LAB — CBC WITH DIFFERENTIAL/PLATELET
BASOS ABS: 0 10*3/uL (ref 0.0–0.1)
BASOS PCT: 0 % (ref 0–1)
EOS ABS: 0 10*3/uL (ref 0.0–0.7)
EOS PCT: 0 % (ref 0–5)
HCT: 33.8 % — ABNORMAL LOW (ref 36.0–46.0)
Hemoglobin: 11.4 g/dL — ABNORMAL LOW (ref 12.0–15.0)
Lymphocytes Relative: 17 % (ref 12–46)
Lymphs Abs: 2.1 10*3/uL (ref 0.7–4.0)
MCH: 29.3 pg (ref 26.0–34.0)
MCHC: 33.7 g/dL (ref 30.0–36.0)
MCV: 86.9 fL (ref 78.0–100.0)
MONO ABS: 1 10*3/uL (ref 0.1–1.0)
Monocytes Relative: 7 % (ref 3–12)
Neutro Abs: 9.8 10*3/uL — ABNORMAL HIGH (ref 1.7–7.7)
Neutrophils Relative %: 76 % (ref 43–77)
Platelets: 419 10*3/uL — ABNORMAL HIGH (ref 150–400)
RBC: 3.89 MIL/uL (ref 3.87–5.11)
RDW: 15.1 % (ref 11.5–15.5)
WBC: 12.9 10*3/uL — ABNORMAL HIGH (ref 4.0–10.5)

## 2015-04-20 LAB — COMPREHENSIVE METABOLIC PANEL
ALT: 18 U/L (ref 14–54)
AST: 32 U/L (ref 15–41)
Albumin: 2.4 g/dL — ABNORMAL LOW (ref 3.5–5.0)
Alkaline Phosphatase: 65 U/L (ref 38–126)
Anion gap: 14 (ref 5–15)
BILIRUBIN TOTAL: 1.3 mg/dL — AB (ref 0.3–1.2)
BUN: 19 mg/dL (ref 6–20)
CO2: 20 mmol/L — AB (ref 22–32)
Calcium: 9.7 mg/dL (ref 8.9–10.3)
Chloride: 110 mmol/L (ref 101–111)
Creatinine, Ser: 1.16 mg/dL — ABNORMAL HIGH (ref 0.44–1.00)
GFR, EST AFRICAN AMERICAN: 44 mL/min — AB (ref >60)
GFR, EST NON AFRICAN AMERICAN: 38 mL/min — AB (ref >60)
Glucose, Bld: 132 mg/dL — ABNORMAL HIGH (ref 65–99)
POTASSIUM: 3.3 mmol/L — AB (ref 3.5–5.1)
SODIUM: 144 mmol/L (ref 135–145)
Total Protein: 6.4 g/dL — ABNORMAL LOW (ref 6.5–8.1)

## 2015-04-20 LAB — URINALYSIS, ROUTINE W REFLEX MICROSCOPIC
Glucose, UA: NEGATIVE mg/dL
Ketones, ur: 15 mg/dL — AB
LEUKOCYTES UA: NEGATIVE
Nitrite: NEGATIVE
PROTEIN: 30 mg/dL — AB
Specific Gravity, Urine: >1.03 — ABNORMAL HIGH (ref 1.005–1.030)
Urobilinogen, UA: 0.2 mg/dL (ref 0.0–1.0)
pH: 5.5 (ref 5.0–8.0)

## 2015-04-20 LAB — LIPASE, BLOOD: Lipase: 15 U/L — ABNORMAL LOW (ref 22–51)

## 2015-04-20 LAB — I-STAT CG4 LACTIC ACID, ED: LACTIC ACID, VENOUS: 1.69 mmol/L (ref 0.5–2.0)

## 2015-04-20 MED ORDER — DILTIAZEM HCL 100 MG IV SOLR
5.0000 mg/h | Freq: Once | INTRAVENOUS | Status: DC
  Filled 2015-04-20: qty 100

## 2015-04-20 MED ORDER — POTASSIUM CHLORIDE 10 MEQ/100ML IV SOLN
10.0000 meq | INTRAVENOUS | Status: AC
  Administered 2015-04-20 – 2015-04-21 (×2): 10 meq via INTRAVENOUS
  Filled 2015-04-20 (×2): qty 100

## 2015-04-20 MED ORDER — SODIUM CHLORIDE 0.9 % IV SOLN: INTRAVENOUS | Status: DC

## 2015-04-20 MED ORDER — LEVOFLOXACIN IN D5W 750 MG/150ML IV SOLN
750.0000 mg | Freq: Once | INTRAVENOUS | Status: AC
  Administered 2015-04-20: 750 mg via INTRAVENOUS
  Filled 2015-04-20: qty 150

## 2015-04-20 MED ORDER — SODIUM CHLORIDE 0.9 % IV SOLN
Freq: Once | INTRAVENOUS | Status: AC
  Administered 2015-04-20: 23:00:00 via INTRAVENOUS

## 2015-04-20 NOTE — H&P (Addendum)
Melanie Byrd is an 81 y.o. female.    Dr. Lunette Stands (pcp, [place], [place])  Chief Complaint: FTT HPI: 81 yo female with htn, hyperlipidemia, CHF , Pafib (CHADS2=3), protein calorie malnutrition, apparently recently discharged from [facility] on 04/18/2015 apparently has had difficulty with swallowing since she has been home.  Eating less. More confusion.  Family feels that she is dehydrated.  Pt presented to ED for evaluation and ED requests admission for FTT.    Past Medical History  Diagnosis Date  . Hypertension   . Hypercholesterolemia   . Renal disorder   . CHF (congestive heart failure)   . Arthritis   . Pressure ulcer     unstageable coccyx and sacrum with eschar  . Osteomyelitis     coccyx  . Myositis   . CKD (chronic kidney disease)   . A-fib     on eliquis for years per family    Past Surgical History  Procedure Laterality Date  . Hernia repair      about 40years ago     History reviewed. No pertinent family history. Social History:  reports that she has never smoked. She has never used smokeless tobacco. She reports that she does not drink alcohol or use illicit drugs.  Allergies:  Allergies  Allergen Reactions  . Cephalexin Other (See Comments)    ams  Medications reviewed   Results for orders placed or performed during the hospital encounter of 04/20/15 (from the past 48 hour(s))  CBC with Differential/Platelet     Status: Abnormal   Collection Time: 04/20/15  9:14 PM  Result Value Ref Range   WBC 12.9 (H) 4.0 - 10.5 K/uL   RBC 3.89 3.87 - 5.11 MIL/uL   Hemoglobin 11.4 (L) 12.0 - 15.0 g/dL   HCT 33.8 (L) 36.0 - 46.0 %   MCV 86.9 78.0 - 100.0 fL   MCH 29.3 26.0 - 34.0 pg   MCHC 33.7 30.0 - 36.0 g/dL   RDW 15.1 11.5 - 15.5 %   Platelets 419 (H) 150 - 400 K/uL   Neutrophils Relative % 76 43 - 77 %   Neutro Abs 9.8 (H) 1.7 - 7.7 K/uL   Lymphocytes Relative 17 12 - 46 %   Lymphs Abs 2.1 0.7 - 4.0 K/uL   Monocytes Relative 7 3 - 12 %   Monocytes Absolute  1.0 0.1 - 1.0 K/uL   Eosinophils Relative 0 0 - 5 %   Eosinophils Absolute 0.0 0.0 - 0.7 K/uL   Basophils Relative 0 0 - 1 %   Basophils Absolute 0.0 0.0 - 0.1 K/uL  Comprehensive metabolic panel     Status: Abnormal   Collection Time: 04/20/15  9:14 PM  Result Value Ref Range   Sodium 144 135 - 145 mmol/L   Potassium 3.3 (L) 3.5 - 5.1 mmol/L   Chloride 110 101 - 111 mmol/L   CO2 20 (L) 22 - 32 mmol/L   Glucose, Bld 132 (H) 65 - 99 mg/dL   BUN 19 6 - 20 mg/dL   Creatinine, Ser 1.16 (H) 0.44 - 1.00 mg/dL   Calcium 9.7 8.9 - 10.3 mg/dL   Total Protein 6.4 (L) 6.5 - 8.1 g/dL   Albumin 2.4 (L) 3.5 - 5.0 g/dL   AST 32 15 - 41 U/L   ALT 18 14 - 54 U/L   Alkaline Phosphatase 65 38 - 126 U/L   Total Bilirubin 1.3 (H) 0.3 - 1.2 mg/dL   GFR calc non Af Wyvonnia Lora  38 (L) >60 mL/min   GFR calc Af Amer 44 (L) >60 mL/min    Comment: (NOTE) The eGFR has been calculated using the CKD EPI equation. This calculation has not been validated in all clinical situations. eGFR's persistently <60 mL/min signify possible Chronic Kidney Disease.    Anion gap 14 5 - 15  Lipase, blood     Status: Abnormal   Collection Time: 04/20/15  9:14 PM  Result Value Ref Range   Lipase 15 (L) 22 - 51 U/L  Urinalysis, Routine w reflex microscopic (not at [facility])     Status: Abnormal   Collection Time: 04/20/15  9:38 PM  Result Value Ref Range   Color, Urine YELLOW YELLOW   APPearance CLEAR CLEAR   Specific Gravity, Urine >1.030 (H) 1.005 - 1.030   pH 5.5 5.0 - 8.0   Glucose, UA NEGATIVE NEGATIVE mg/dL   Hgb urine dipstick MODERATE (A) NEGATIVE   Bilirubin Urine SMALL (A) NEGATIVE   Ketones, ur 15 (A) NEGATIVE mg/dL   Protein, ur 30 (A) NEGATIVE mg/dL   Urobilinogen, UA 0.2 0.0 - 1.0 mg/dL   Nitrite NEGATIVE NEGATIVE   Leukocytes, UA NEGATIVE NEGATIVE  Urine microscopic-add on     Status: Abnormal   Collection Time: 04/20/15  9:38 PM  Result Value Ref Range   Squamous Epithelial / LPF RARE RARE   WBC, UA 3-6 <3  WBC/hpf   RBC / HPF 3-6 <3 RBC/hpf   Bacteria, UA FEW (A) RARE  I-Stat CG4 Lactic Acid, ED     Status: None   Collection Time: 04/20/15  9:45 PM  Result Value Ref Range   Lactic Acid, Venous 1.69 0.5 - 2.0 mmol/L   Dg Chest 1 View  04/20/2015   CLINICAL DATA:  Patient was admitted to hospital last week for bed source with discharge on 04/18/2015. Patient is refusing to eat and has not eaten since Friday. Weakness.  EXAM: CHEST  1 VIEW  COMPARISON:  04/14/2015  FINDINGS: Normal heart size and pulmonary vascularity for technique. Slightly shallow inspiration. Linear infiltration or atelectasis demonstrated in the right lung base. This has progressed since previous study. No blunting of costophrenic angles. No pneumothorax. Calcified and tortuous aorta.  IMPRESSION: Shallow inspiration with linear infiltration or atelectasis in the right lung base.   Electronically Signed   By: Lucienne Capers M.D.   On: 04/20/2015 22:20    Review of Systems  Constitutional: Negative.   HENT: Negative.   Eyes: Negative.   Respiratory: Negative.   Cardiovascular: Negative.   Gastrointestinal: Negative.   Genitourinary: Negative.   Musculoskeletal: Negative.   Skin: Negative.   Neurological: Negative.   Endo/Heme/Allergies: Negative.   Psychiatric/Behavioral: Negative.     Blood pressure 151/85, pulse 48, temperature 99.9 F (37.7 C), temperature source Rectal, resp. rate 26, SpO2 100 %. Physical Exam  Constitutional: She is oriented to person, place, and time. She appears well-developed and well-nourished.  HENT:  Head: Normocephalic and atraumatic.  Mouth/Throat: No oropharyngeal exudate.  Eyes: Conjunctivae and EOM are normal. Pupils are equal, round, and reactive to light. No scleral icterus.  Neck: Normal range of motion. Neck supple. No JVD present. No tracheal deviation present. No thyromegaly present.  Cardiovascular: Normal rate and regular rhythm.  Exam reveals no gallop and no friction rub.    Murmur heard. Respiratory: Effort normal and breath sounds normal. No respiratory distress. She has no wheezes. She has no rales. She exhibits no tenderness.  GI: Soft. Bowel sounds are  normal. She exhibits no distension. There is no tenderness. There is no rebound and no guarding.  Musculoskeletal: Normal range of motion. She exhibits no edema or tenderness.  Lymphadenopathy:    She has no cervical adenopathy.  Neurological: She is alert and oriented to person, place, and time. She has normal reflexes. She displays normal reflexes. No cranial nerve deficit. She exhibits normal muscle tone. Coordination normal.  Skin: Skin is warm and dry. No rash noted. No erythema. No pallor.  + sacral decubitus ulcer  Psychiatric: She has a normal mood and affect. Her behavior is normal. Judgment and thought content normal.     Assessment/Plan Dysphagia GI consultation ordered  FTT Family would like placement at [facility] Hydration with ns iv overnite  Anemia Check cbc in am  Sacral decubitus ulcer levaquin per pharmacy consult Wound culture  CKD stage 2 Check cmp in am  Hyperglycemia Check hga1c  Pafib (CHADS2=3) Cont eliquis  Cardiac murmer, check echo  CODE STATUS DNR  Jani Gravel 04/20/2015, 10:47 PM

## 2015-04-20 NOTE — Progress Notes (Signed)
ANTIBIOTIC CONSULT NOTE-Preliminary  Pharmacy Consult for levofloxacin  Indication: wound infection  Allergies  Allergen Reactions  . Cephalexin Other (See Comments)    ams    Patient Measurements:     Vital Signs: Temp: 99.9 F (37.7 C) (07/10 2043) Temp Source: Rectal (07/10 2043) BP: 145/74 mmHg (07/10 2300) Pulse Rate: 50 (07/10 2300)  Labs:  Recent Labs  04/18/15 0628 04/20/15 2114  WBC 12.0* 12.9*  HGB 10.3* 11.4*  PLT 322 419*  CREATININE 1.39* 1.16*    Estimated Creatinine Clearance: 22.4 mL/min (by C-G formula based on Cr of 1.16).  No results for input(s): VANCOTROUGH, VANCOPEAK, VANCORANDOM, GENTTROUGH, GENTPEAK, GENTRANDOM, TOBRATROUGH, TOBRAPEAK, TOBRARND, AMIKACINPEAK, AMIKACINTROU, AMIKACIN in the last 72 hours.   Microbiology: Recent Results (from the past 720 hour(s))  Urine culture     Status: None   Collection Time: 03/28/15  3:09 PM  Result Value Ref Range Status   Specimen Description URINE, CATHETERIZED  Final   Special Requests NONE  Final   Culture   Final    80,000 COLONIES/ml PSEUDOMONAS AERUGINOSA [facility]    Report Status 03/30/2015 FINAL  Final   Organism ID, Bacteria PSEUDOMONAS AERUGINOSA  Final      Susceptibility   Pseudomonas aeruginosa - MIC*    CEFTAZIDIME 4 SENSITIVE Sensitive     CIPROFLOXACIN <=0.25 SENSITIVE Sensitive     GENTAMICIN 4 SENSITIVE Sensitive     IMIPENEM 1 SENSITIVE Sensitive     PIP/TAZO <=4 SENSITIVE Sensitive     CEFEPIME 2 SENSITIVE Sensitive     * 80,000 COLONIES/ml PSEUDOMONAS AERUGINOSA  Urine culture     Status: None   Collection Time: 04/14/15  1:30 PM  Result Value Ref Range Status   Specimen Description URINE, CATHETERIZED  Final   Special Requests NONE  Final   Culture   Final    NO GROWTH 2 DAYS Performed at [facility]    Report Status 04/16/2015 FINAL  Final  Culture, blood (routine x 2)     Status: None   Collection Time: 04/14/15  1:32 PM   Result Value Ref Range Status   Specimen Description BLOOD RIGHT HAND  Final   Special Requests BOTTLES DRAWN AEROBIC ONLY 8CC  Final   Culture NO GROWTH 5 DAYS  Final   Report Status 04/19/2015 FINAL  Final  Culture, blood (routine x 2)     Status: None   Collection Time: 04/14/15  1:33 PM  Result Value Ref Range Status   Specimen Description BLOOD LEFT HAND  Final   Special Requests   Final    BOTTLES DRAWN AEROBIC AND ANAEROBIC AEB 6CC ANA 6CC   Culture NO GROWTH 5 DAYS  Final   Report Status 04/19/2015 FINAL  Final    Medical History: Past Medical History  Diagnosis Date  . Hypertension   . Hypercholesterolemia   . Renal disorder   . CHF (congestive heart failure)   . Arthritis   . Pressure ulcer     unstageable coccyx and sacrum with eschar  . Osteomyelitis     coccyx  . Myositis   . CKD (chronic kidney disease)   . A-fib     on eliquis for years per family  . CAD (coronary artery disease) 11/2013    Medications:  Scheduled:    Assessment: 81 yo female having difficulty swallowing admitted for FTT. Pt has sacral decubitus ulcer and will be treated with levofloxacin.    Goal of Therapy:  eradication of infection  Plan:  Preliminary review of pertinent patient information completed.  Protocol will be initiated with a one-time dose(s) of levofloxacin 750 mg IV x 1.  Jeani Hawking clinical pharmacist will complete review during morning rounds to assess patient and finalize treatment regimen.  Loyola Mast, RPH 04/20/2015,11:16 PM

## 2015-04-20 NOTE — ED Provider Notes (Signed)
CSN: [id number]     Arrival date & time 04/20/15  2022 History  This chart was scribed for Melanie Octave, MD by Placido Sou, ED scribe. This patient was seen in room APA06/APA06 and the patient's care was started at 8:33 PM.    Chief Complaint  Patient presents with  . Failure To Thrive   The history is provided by a relative and medical records. The history is limited by the condition of the patient and the absence of a caregiver. No language interpreter was used.   LEVEL 5 CAVEAT: MENTAL STATUS    HPI Comments: Melanie Byrd is a 81 y.o. female who presents to the Emergency Department by ambulance due to generalized, worsening, weakness since her last visit on 04/14/2015. Nurse notes that the pt is currently living at home and that since her last visit she has not been eating, drinking or taking her medications. Pt denies CP, abd pain, or myalgias. Pt is generally unresponsive to questioning.   Pt's daughter notes that she is having acute trouble swallowing for 7 days. Her daughter further notes that it takes her close to an hour to swallow her medication. She notes that she received medication for her infection after her last ED visit and she was given oral Rx's. Pt's daughter notes her temperature was 100 yesterday. Her daughter confirms a history of HTN and high cholesterol.   Past Medical History  Diagnosis Date  . Hypertension   . Hypercholesterolemia   . Renal disorder   . CHF (congestive heart failure)   . Arthritis   . Pressure ulcer     unstageable coccyx and sacrum with eschar  . Osteomyelitis     coccyx  . Myositis   . CKD (chronic kidney disease)   . A-fib     on eliquis for years per family   Past Surgical History  Procedure Laterality Date  . Hernia repair      about 40years ago    History reviewed. No pertinent family history. History  Substance Use Topics  . Smoking status: Never Smoker   . Smokeless tobacco: Never Used  . Alcohol Use: No   OB History     Gravida Para Term Preterm AB TAB SAB Ectopic Multiple Living   Review of Systems  Unable to perform ROS: Patient unresponsive   A complete 10 system review of systems was obtained and all systems are negative except as noted in the HPI and PMH.   Allergies  Cephalexin  Home Medications   Prior to Admission medications   Medication Sig Start Date End Date Taking? Authorizing Provider  acetaminophen (TYLENOL) 650 MG suppository Place 1 suppository (650 mg total) rectally every 6 (six) hours as needed for fever or moderate pain (rectal temp >100.5). 04/18/15  Yes Lesle Chris Black, NP  amitriptyline (ELAVIL) 10 MG tablet Take 10 mg by mouth at bedtime as needed for sleep.    Yes Historical Provider, MD  amLODipine (NORVASC) 5 MG tablet Take 5 mg by mouth daily.   Yes Historical Provider, MD  apixaban (ELIQUIS) 2.5 MG TABS tablet Take 2.5 mg by mouth 2 (two) times daily.   Yes Historical Provider, MD  atorvastatin (LIPITOR) 20 MG tablet Take 20 mg by mouth every evening.   Yes Historical Provider, MD  cholecalciferol (VITAMIN D) 1000 UNITS tablet Take 1,000 Units by mouth every morning.   Yes Historical Provider, MD  ciprofloxacin (  CIPRO) 500 MG tablet Take 1 tablet (500 mg total) by mouth 2 (two) times daily. 04/18/15  Yes Lesle ChrisKaren M Black, NP  collagenase (SANTYL) ointment Apply topically daily. 04/18/15  Yes Lesle ChrisKaren M Black, NP  docusate sodium (COLACE) 100 MG capsule Take 100 mg by mouth 2 (two) times daily.   Yes Historical Provider, MD  escitalopram (LEXAPRO) 10 MG tablet Take 10 mg by mouth daily.   Yes Historical Provider, MD  folic acid ([employer] FOLIC ACID) 800 MCG tablet Take 800 mcg by mouth every morning.   Yes Historical Provider, MD  gabapentin (NEURONTIN) 100 MG capsule Take 100 mg by mouth 3 (three) times daily.   Yes Historical Provider, MD  metoprolol succinate (TOPROL-XL) 50 MG 24 hr tablet Take 50 mg by mouth at bedtime. Take with or immediately following a meal.   Yes  Historical Provider, MD  traMADol (ULTRAM) 50 MG tablet Take 50 mg by mouth at bedtime.   Yes Historical Provider, MD  triamterene-hydrochlorothiazide (MAXZIDE-25) 37.5-25 MG per tablet Take 0.5 tablets by mouth every other day.   Yes Historical Provider, MD  Amino Acids-Protein Hydrolys (FEEDING SUPPLEMENT, PRO-STAT 64,) LIQD Take 30 mLs by mouth 2 (two) times daily at 10 AM and 5 PM. 04/18/15   Gwenyth BenderKaren M Black, NP  Maltodextrin-Xanthan Gum (RESOURCE THICKENUP CLEAR) POWD Take 120 g by mouth as needed (Nectar thick liquids). 04/18/15   Gwenyth BenderKaren M Black, NP   BP 151/85 mmHg  Pulse 48  Temp(Src) 99.9 F (37.7 C) (Rectal)  Resp 26  SpO2 100% Physical Exam  Constitutional: No distress.  Nonverbal; followed some commands; resists eye opening  HENT:  Head: Normocephalic and atraumatic.  Mouth/Throat: No oropharyngeal exudate.  Eyes: Conjunctivae and EOM are normal. Pupils are equal, round, and reactive to light.  Neck: Normal range of motion. Neck supple.  No meningismus.  Cardiovascular: Intact distal pulses.   Murmur heard. Systolic murmur;  Irregular rhythm  Pulmonary/Chest: Effort normal and breath sounds normal. No respiratory distress.  Abdominal: Soft. There is no tenderness. There is no rebound and no guarding.  Musculoskeletal: Normal range of motion. She exhibits tenderness. She exhibits no edema.  Neurological: No cranial nerve deficit. She exhibits normal muscle tone. Coordination normal.  Follows some commands, moving all extremities  Skin: Skin is warm.  large decubitus ulcer to sacrum   Psychiatric: She has a normal mood and affect. Her behavior is normal.  Nursing note and vitals reviewed.   ED Course  Procedures  DIAGNOSTIC STUDIES: Oxygen Saturation is 97% on RA, normal by my interpretation.    COORDINATION OF CARE: 8:44 PM Discussed treatment plan with pt at bedside and pt agreed to plan.  9:53 PM Discussed pt's symptoms with her daughter after her arrival   Labs  Review Labs Reviewed  CBC WITH DIFFERENTIAL/PLATELET - Abnormal; Notable for the following:    WBC 12.9 (*)    Hemoglobin 11.4 (*)    HCT 33.8 (*)    Platelets 419 (*)    Neutro Abs 9.8 (*)    All other components within normal limits  COMPREHENSIVE METABOLIC PANEL - Abnormal; Notable for the following:    Potassium 3.3 (*)    CO2 20 (*)    Glucose, Bld 132 (*)    Creatinine, Ser 1.16 (*)    Total Protein 6.4 (*)    Albumin 2.4 (*)    Total Bilirubin 1.3 (*)    GFR calc non Af Amer 38 (*)    GFR calc  Af Amer 44 (*)    All other components within normal limits  URINALYSIS, ROUTINE W REFLEX MICROSCOPIC (NOT AT [facility]) - Abnormal; Notable for the following:    Specific Gravity, Urine >1.030 (*)    Hgb urine dipstick MODERATE (*)    Bilirubin Urine SMALL (*)    Ketones, ur 15 (*)    Protein, ur 30 (*)    All other components within normal limits  LIPASE, BLOOD - Abnormal; Notable for the following:    Lipase 15 (*)    All other components within normal limits  URINE MICROSCOPIC-ADD ON - Abnormal; Notable for the following:    Bacteria, UA FEW (*)    All other components within normal limits  CULTURE, BLOOD (ROUTINE X 2)  CULTURE, BLOOD (ROUTINE X 2)  I-STAT CG4 LACTIC ACID, ED    Imaging Review Dg Chest 1 View  04/20/2015   CLINICAL DATA:  Patient was admitted to hospital last week for bed source with discharge on 04/18/2015. Patient is refusing to eat and has not eaten since Friday. Weakness.  EXAM: CHEST  1 VIEW  COMPARISON:  04/14/2015  FINDINGS: Normal heart size and pulmonary vascularity for technique. Slightly shallow inspiration. Linear infiltration or atelectasis demonstrated in the right lung base. This has progressed since previous study. No blunting of costophrenic angles. No pneumothorax. Calcified and tortuous aorta.  IMPRESSION: Shallow inspiration with linear infiltration or atelectasis in the right lung base.   Electronically Signed   By: Burman Nieves M.D.   On:  04/20/2015 22:20     EKG Interpretation   Date/Time:  Sunday April 20 2015 21:32:03 EDT Ventricular Rate:  129 PR Interval:    QRS Duration: 85 QT Interval:  332 QTC Calculation: 486 R Axis:   23 Text Interpretation:  Atrial fibrillation Nonspecific T abnrm,  anterolateral leads Borderline prolonged QT interval No significant change  was found Confirmed by Manus Gunning  MD, Paiden Caraveo [phone]) on 04/20/2015 10:35:15  PM      MDM   Final diagnoses:  Weakness   patient from home with not eating and drinking since discharge from the hospital 3 days ago. Patient with known sacral osteomyelitis taking by mouth Cipro. Family denies any fever.  She is in atrial fibrillation on arrival. She is unable to give a history.  Labs show no acute abnormalities. EKG with afib with RVR.  Rate varied in the ED.  Speech evaluation during previous admission suggested dysphagia was due to cognitive deficits and discouraged feeding tube.  Family requesting placement at this time. They confirm DNR.  Admit for hydration and IV antibiotics.  D/w Dr. Selena Batten.   Melanie Octave, MD 04/21/15 [phone]

## 2015-04-20 NOTE — ED Notes (Signed)
Pt repositioned after obtaining  Urine specimen - Tilted onto her side with pillow at back for support and cushioning placed between knees-

## 2015-04-20 NOTE — ED Notes (Signed)
Pt was admitted to hospital last week for bedsores and pt was discharged on 04-18-15; family told ems that pt is refusing to eat and has not eaten since Friday;

## 2015-04-21 ENCOUNTER — Inpatient Hospital Stay (INDEPENDENT_AMBULATORY_CARE_PROVIDER_SITE_OTHER): Payer: Medicare Other

## 2015-04-21 DIAGNOSIS — R011 Cardiac murmur, unspecified: Secondary | ICD-10-CM

## 2015-04-21 LAB — CBC WITH DIFFERENTIAL/PLATELET
Basophils Absolute: 0 10*3/uL (ref 0.0–0.1)
Basophils Relative: 0 % (ref 0–1)
EOS PCT: 1 % (ref 0–5)
Eosinophils Absolute: 0.1 10*3/uL (ref 0.0–0.7)
HCT: 31.2 % — ABNORMAL LOW (ref 36.0–46.0)
Hemoglobin: 10.3 g/dL — ABNORMAL LOW (ref 12.0–15.0)
LYMPHS PCT: 17 % (ref 12–46)
Lymphs Abs: 1.6 10*3/uL (ref 0.7–4.0)
MCH: 28.9 pg (ref 26.0–34.0)
MCHC: 33 g/dL (ref 30.0–36.0)
MCV: 87.6 fL (ref 78.0–100.0)
MONOS PCT: 7 % (ref 3–12)
Monocytes Absolute: 0.7 10*3/uL (ref 0.1–1.0)
Neutro Abs: 7.5 10*3/uL (ref 1.7–7.7)
Neutrophils Relative %: 75 % (ref 43–77)
PLATELETS: 387 10*3/uL (ref 150–400)
RBC: 3.56 MIL/uL — ABNORMAL LOW (ref 3.87–5.11)
RDW: 15.1 % (ref 11.5–15.5)
WBC: 9.9 10*3/uL (ref 4.0–10.5)

## 2015-04-21 LAB — COMPREHENSIVE METABOLIC PANEL
ALBUMIN: 2 g/dL — AB (ref 3.5–5.0)
ALK PHOS: 54 U/L (ref 38–126)
ALT: 15 U/L (ref 14–54)
ANION GAP: 8 (ref 5–15)
AST: 28 U/L (ref 15–41)
BILIRUBIN TOTAL: 0.9 mg/dL (ref 0.3–1.2)
BUN: 17 mg/dL (ref 6–20)
CO2: 25 mmol/L (ref 22–32)
CREATININE: 1.04 mg/dL — AB (ref 0.44–1.00)
Calcium: 9.2 mg/dL (ref 8.9–10.3)
Chloride: 110 mmol/L (ref 101–111)
GFR calc Af Amer: 50 mL/min — ABNORMAL LOW (ref >60)
GFR, EST NON AFRICAN AMERICAN: 44 mL/min — AB (ref >60)
Glucose, Bld: 120 mg/dL — ABNORMAL HIGH (ref 65–99)
Potassium: 3.4 mmol/L — ABNORMAL LOW (ref 3.5–5.1)
Sodium: 143 mmol/L (ref 135–145)
TOTAL PROTEIN: 5.6 g/dL — AB (ref 6.5–8.1)

## 2015-04-21 LAB — SEDIMENTATION RATE: Sed Rate: 75 mm/hr — ABNORMAL HIGH (ref 0–22)

## 2015-04-21 LAB — TSH: TSH: 0.517 u[IU]/mL (ref 0.350–4.500)

## 2015-04-21 MED ORDER — LEVOFLOXACIN IN D5W 500 MG/100ML IV SOLN: 500.0000 mg | INTRAVENOUS | Status: DC

## 2015-04-21 MED ORDER — ATORVASTATIN CALCIUM 20 MG PO TABS: 20.0000 mg | ORAL_TABLET | Freq: Every evening | ORAL | Status: DC

## 2015-04-21 MED ORDER — ESCITALOPRAM OXALATE 10 MG PO TABS: 10.0000 mg | ORAL_TABLET | Freq: Every day | ORAL | Status: DC

## 2015-04-21 MED ORDER — PRO-STAT 64 PO LIQD: 30.0000 mL | Freq: Two times a day (BID) | ORAL | Status: DC

## 2015-04-21 MED ORDER — TRAMADOL HCL 50 MG PO TABS: 50.0000 mg | ORAL_TABLET | Freq: Every day | ORAL | Status: DC

## 2015-04-21 MED ORDER — COLLAGENASE 250 UNIT/GM EX OINT
TOPICAL_OINTMENT | Freq: Every day | CUTANEOUS | Status: DC
  Administered 2015-04-21 – 2015-04-23 (×3): via TOPICAL
  Filled 2015-04-21: qty 30

## 2015-04-21 MED ORDER — CETYLPYRIDINIUM CHLORIDE 0.05 % MT LIQD
7.0000 mL | Freq: Two times a day (BID) | OROMUCOSAL | Status: DC
  Administered 2015-04-21 – 2015-04-23 (×5): 7 mL via OROMUCOSAL

## 2015-04-21 MED ORDER — ACETAMINOPHEN 650 MG RE SUPP
650.0000 mg | Freq: Four times a day (QID) | RECTAL | Status: DC | PRN
  Administered 2015-04-21: 650 mg via RECTAL
  Filled 2015-04-21: qty 1

## 2015-04-21 MED ORDER — METOPROLOL SUCCINATE ER 50 MG PO TB24: 50.0000 mg | ORAL_TABLET | Freq: Every day | ORAL | Status: DC

## 2015-04-21 MED ORDER — HYDRALAZINE HCL 20 MG/ML IJ SOLN
5.0000 mg | Freq: Four times a day (QID) | INTRAMUSCULAR | Status: DC | PRN
Start: 2015-04-21 — End: 2015-04-22

## 2015-04-21 MED ORDER — GABAPENTIN 100 MG PO CAPS: 100.0000 mg | ORAL_CAPSULE | Freq: Three times a day (TID) | ORAL | Status: DC

## 2015-04-21 MED ORDER — VITAMIN D 1000 UNITS PO TABS: 1000.0000 [IU] | ORAL_TABLET | Freq: Every morning | ORAL | Status: DC

## 2015-04-21 MED ORDER — FOLIC ACID 1 MG PO TABS: 1.0000 mg | ORAL_TABLET | Freq: Every morning | ORAL | Status: DC

## 2015-04-21 MED ORDER — TRIAMTERENE-HCTZ 37.5-25 MG PO TABS: 0.5000 | ORAL_TABLET | ORAL | Status: DC

## 2015-04-21 MED ORDER — SODIUM CHLORIDE 0.9 % IJ SOLN
3.0000 mL | Freq: Two times a day (BID) | INTRAMUSCULAR | Status: DC
  Administered 2015-04-21 (×2): 3 mL via INTRAVENOUS

## 2015-04-21 MED ORDER — ACETAMINOPHEN 325 MG PO TABS: 650.0000 mg | ORAL_TABLET | Freq: Four times a day (QID) | ORAL | Status: DC | PRN

## 2015-04-21 MED ORDER — POTASSIUM CHLORIDE IN NACL 20-0.9 MEQ/L-% IV SOLN
INTRAVENOUS | Status: DC
  Administered 2015-04-21 (×2): via INTRAVENOUS

## 2015-04-21 MED ORDER — RESOURCE THICKENUP CLEAR PO POWD
1.0000 | ORAL | Status: DC | PRN
  Filled 2015-04-21: qty 125

## 2015-04-21 MED ORDER — APIXABAN 5 MG PO TABS: 2.5000 mg | ORAL_TABLET | Freq: Two times a day (BID) | ORAL | Status: DC

## 2015-04-21 MED ORDER — SODIUM CHLORIDE 0.9 % IV SOLN
INTRAVENOUS | Status: AC
  Administered 2015-04-21: 01:00:00 via INTRAVENOUS

## 2015-04-21 MED ORDER — AMITRIPTYLINE HCL 10 MG PO TABS: 10.0000 mg | ORAL_TABLET | Freq: Every evening | ORAL | Status: DC | PRN

## 2015-04-21 MED ORDER — CIPROFLOXACIN HCL 250 MG PO TABS: 500.0000 mg | ORAL_TABLET | Freq: Two times a day (BID) | ORAL | Status: DC

## 2015-04-21 MED ORDER — AMLODIPINE BESYLATE 5 MG PO TABS: 5.0000 mg | ORAL_TABLET | Freq: Every day | ORAL | Status: DC

## 2015-04-21 MED ORDER — DOCUSATE SODIUM 100 MG PO CAPS: 100.0000 mg | ORAL_CAPSULE | Freq: Two times a day (BID) | ORAL | Status: DC

## 2015-04-21 NOTE — Progress Notes (Signed)
SLP Cancellation Note  Patient Details Name: Melanie Byrd MRN: [name]6 DOB: 11/11/1915   Cancelled treatment:       Reason Eval/Treat Not Completed: Fatigue/lethargy limiting ability to participate;Medical issues which prohibited therapy; Pt known to this therapist from last week's admission. RN reports that family (daughter) has now been more accepting of palliative consult which appears appropriate. Dysphagia was primarily cognitive-based when evaluated last week- holding/pocketing food and drink, but when swallow was elicited she did well. Pt appears to be nearing end-of-life and not wanting food/drink. Would suggest offering ice chips if pt requesting; provide oral care for comfort. SLP will follow up after hospice consult.  Below is my last note from Thursday: <<Pt's current dysphagia is largely cognitively based which puts her at risk for malnutrition and aspiration. Feeding tubes are not recommended in this setting (advanced age with cognitive decline). Daughter expressed to SLP that "she was fine before all this happened... Nothing wrong with her memory" and I acknowledged her accounts and reinforced that her advanced age, being out of her typical routine, pain and pain medications, and osteomyelitis created a "perfect storm" and negatively impacted her swallow. SLP has concerns that family will not be able to adequately care for pt at home in this state. SLP asked daughter to help reposition pt in bed and she stated that she was unable (and listed several ailments). Pt may be appropriate for hospice consult to facilitate additional family education and support with decisions. It is possible that if alertness improves (now off of morphine), pt will eat/drink more. Recommend continued diet order with a focus on NTL, ice cream ok, try Magic Cups as pt responded best to cold temperature. OK for ice chips. Continue oral care. Intake will likely be minimal until alertness improves. Above to daughter, RN,  MD, and NP.>>  Thank you,  Havery MorosDabney Montzerrat Brunell, CCC-SLP [phone]  Jaecion Dempster 04/21/2015, 5:00 PM

## 2015-04-21 NOTE — Clinical Social Work Note (Signed)
CSW received consult for possible SNF. Awaiting palliative care consult for recommendations. CSW will follow up once complete to determine appropriate disposition and provide support.   Derenda FennelKara Dawanda Mapel, LCSW [phone]

## 2015-04-21 NOTE — Consult Note (Signed)
WOC wound consult note Reason for Consult: Chronic sacral ulcer, present on admission. [facility]  Surgery debrided at the bedside.  Patient PO intake has stopped.  Albumin 2.0 Wound type:Chronic Stage IV pressure ulcer, bone directly palpable.  Pressure Ulcer POA: Yes Will order LALM for pressure redistribution Measurement:3 cm x 2 cm x 2.4 cm (noted last week) with greater than 50% slough in wound bed.  Wound bed:50% slough in wound bed. Drainage (amount, consistency, odor) Moderate serosanguinous drainage.  No odor.  Periwound:erythema  Dressing procedure/placement/frequency:Cleanse sacral/buttock wounds with NS and pat gently dry. Apply Santyl ointment to wound bed, 1/8 inch thickness (opaque). Cover dead space in sacral wound with NS moist 2x2. Cover wounds with NS moist 4x4 gauze and Cover with ABD pad and tape. Change daily.  Will not follow at this time.  Please re-consult if needed.  Maple HudsonKaren Ady Heimann RN BSN CWON Pager [phone]

## 2015-04-21 NOTE — Clinical Social Work Note (Signed)
Clinical Social Work Assessment  Patient Details  Name: Melanie Byrd MRN: [id number] Date of Birth: 1916-07-08  Date of referral:  04/21/15               Reason for consult:  End of Life/Hospice                Permission sought to share information with:    Permission granted to share information::     Name::        Agency::     Relationship::     Contact Information:     Housing/Transportation Living arrangements for the past 2 months:  Single Family Home Source of Information:  Adult Children Patient Interpreter Needed:  None Criminal Activity/Legal Involvement Pertinent to Current Situation/Hospitalization:  No - Comment as needed Significant Relationships:  Adult Children Lives with:  Adult Children Do you feel safe going back to the place where you live?  No (daughter feels will need additional services) Need for family participation in patient care:  Yes (Comment)  Care giving concerns:  Pt's daughter has been providing care for pt at home, but feels that they will need to consider other options at this point.    Social Worker assessment / plan:  CSW met with pt's daughter, Hoyle Sauer at bedside. Pt has not had palliative care consult, but after discussion with RN, Hoyle Sauer feels that comfort care and Hospice Home are what pt would want. Hoyle Sauer shared that her sister died a few months ago suddenly. Pt had been living with her until this and then went to live with Hoyle Sauer. Daughter said that until recently, pt had been doing okay. The past few weeks, however she has noticed a decline. Hoyle Sauer states that pt has not eaten in the past 8 days and pockets her medications. She reports she had a [place] conversation with RN today and requests Hospice Home referral. Will discuss with Hospice Home and follow up.   Employment status:  Retired Forensic scientist:  Medicare PT Recommendations:  Not assessed at this time [place] / Referral to community resources:  Other (Comment Required)  (Hospice Home at family request)  Patient/Family's Response to care:  Pt's daughter reports it is "rough" watching her mother decline. She requests comfort care and transition to [place].   Patient/Family's Understanding of and Emotional Response to Diagnosis, Current Treatment, and Prognosis:  Pt's daughter reports understanding of pt's condition and poor prognosis. She indicates she is prepared for what is to come. CSW provided support and discussed coping with her, including self care.   Emotional Assessment Appearance:  Appears stated age Attitude/Demeanor/Rapport:  Unable to Assess Affect (typically observed):  Unable to Assess Orientation:    Alcohol / Substance use:  Not Applicable Psych involvement (Current and /or in the community):  No (Comment)  Discharge Needs  Concerns to be addressed:  Coping/Stress Concerns Readmission within the last 30 days:  Yes Current discharge risk:  Chronically ill Barriers to Discharge:  Continued Medical Work up   [employer], [employer], Arthur 04/21/2015, 3:11 PM [phone]

## 2015-04-21 NOTE — Progress Notes (Signed)
Pt has very poor oral intake per her daughter and will not take her PO medication. Dr.Kim was made aware. Pt also has an unstageable on sacrum that was being dressed at home. Daughter states that nurse came out a few says ago to tach them how to dress the wound. The daughter states that they were informed to put santyl cream on it and cover with gauze then ABD pad.

## 2015-04-21 NOTE — Progress Notes (Signed)
ANTIBIOTIC CONSULT NOTE - FOLLOW UP  Pharmacy Consult for Levaquin Indication: Wound Infection / Osteomyelitis / Sacral decubitus ulcer  Allergies  Allergen Reactions  . Cephalexin Other (See Comments)    ams   Patient Measurements: Height:  (165.1 cm) Weight: 108 lb 14.5 oz (49.4 kg) IBW/kg (Calculated) : 57  Vital Signs: Temp: 98.7 F (37.1 C) (07/11 0657) Temp Source: Oral (07/11 0657) BP: 145/72 mmHg (07/11 0657) Pulse Rate: 97 (07/11 0657) Intake/Output from previous day: 07/10 0701 - 07/11 0700 In: 300 [I.V.:300] Out: -  Intake/Output from this shift:    Labs:  Recent Labs  04/20/15 2114 04/21/15 0632  WBC 12.9* 9.9  HGB 11.4* 10.3*  PLT 419* 387  CREATININE 1.16* 1.04*   Estimated Creatinine Clearance: 24.1 mL/min (by C-G formula based on Cr of 1.04). No results for input(s): VANCOTROUGH, VANCOPEAK, VANCORANDOM, GENTTROUGH, GENTPEAK, GENTRANDOM, TOBRATROUGH, TOBRAPEAK, TOBRARND, AMIKACINPEAK, AMIKACINTROU, AMIKACIN in the last 72 hours.   Microbiology: Recent Results (from the past 720 hour(s))  Urine culture     Status: None   Collection Time: 03/28/15  3:09 PM  Result Value Ref Range Status   Specimen Description URINE, CATHETERIZED  Final   Special Requests NONE  Final   Culture   Final    80,000 COLONIES/ml PSEUDOMONAS AERUGINOSA Performed at [facility]    Report Status 03/30/2015 FINAL  Final   Organism ID, Bacteria PSEUDOMONAS AERUGINOSA  Final      Susceptibility   Pseudomonas aeruginosa - MIC*    CEFTAZIDIME 4 SENSITIVE Sensitive     CIPROFLOXACIN <=0.25 SENSITIVE Sensitive     GENTAMICIN 4 SENSITIVE Sensitive     IMIPENEM 1 SENSITIVE Sensitive     PIP/TAZO <=4 SENSITIVE Sensitive     CEFEPIME 2 SENSITIVE Sensitive     * 80,000 COLONIES/ml PSEUDOMONAS AERUGINOSA  Urine culture     Status: None   Collection Time: 04/14/15  1:30 PM  Result Value Ref Range Status   Specimen Description URINE, CATHETERIZED  Final   Special  Requests NONE  Final   Culture   Final    NO GROWTH 2 DAYS Performed at [facility]    Report Status 04/16/2015 FINAL  Final  Culture, blood (routine x 2)     Status: None   Collection Time: 04/14/15  1:32 PM  Result Value Ref Range Status   Specimen Description BLOOD RIGHT HAND  Final   Special Requests BOTTLES DRAWN AEROBIC ONLY 8CC  Final   Culture NO GROWTH 5 DAYS  Final   Report Status 04/19/2015 FINAL  Final  Culture, blood (routine x 2)     Status: None   Collection Time: 04/14/15  1:33 PM  Result Value Ref Range Status   Specimen Description BLOOD LEFT HAND  Final   Special Requests   Final    BOTTLES DRAWN AEROBIC AND ANAEROBIC AEB 6CC ANA 6CC   Culture NO GROWTH 5 DAYS  Final   Report Status 04/19/2015 FINAL  Final  Blood culture (routine x 2)     Status: None (Preliminary result)   Collection Time: 04/20/15  9:14 PM  Result Value Ref Range Status   Specimen Description BLOOD LEFT HAND  Final   Special Requests BOTTLES DRAWN AEROBIC AND ANAEROBIC 8CC  Final   Culture PENDING  Incomplete   Report Status PENDING  Incomplete  Blood culture (routine x 2)     Status: None (Preliminary result)   Collection Time: 04/20/15  9:24 PM  Result Value Ref Range Status   Specimen Description BLOOD LEFT HAND  Final   Special Requests BOTTLES DRAWN AEROBIC AND ANAEROBIC [facility]6CC  Final   Culture PENDING  Incomplete   Report Status PENDING  Incomplete   Anti-infectives    Start     Dose/Rate Route Frequency Ordered Stop   04/21/15 0100  ciprofloxacin (CIPRO) tablet 500 mg  Status:  Discontinued     500 mg Oral Every 12 hours 04/21/15 0008 04/21/15 0056   04/20/15 2330  levofloxacin (LEVAQUIN) IVPB 750 mg     750 mg 100 mL/hr over 90 Minutes Intravenous  Once 04/20/15 2315 04/21/15 0107     Assessment: Okay for Protocol, previous Cipro therapy.  Estimated Creatinine Clearance: 24.1 mL/min (by C-G formula based on Cr of 1.04).  Initial dose given early this AM.  Goal of  Therapy:  Eradicate infection.   Plan:  Levaquin 500mg  IV every 48 hours. Monitor labs, vitals, culture data.  Mady GemmaHayes, Blyss Lugar R 04/21/2015,8:26 AM

## 2015-04-21 NOTE — Progress Notes (Signed)
Patient Demographics  Melanie Byrd, is a 81 y.o. female, DOB - July 14, 1916, Melanie Byrd  Admit date - 04/20/2015   Admitting Physician Pearson Grippe, MD  Outpatient Primary MD for the patient is VASIREDDY,SABITHA, MD  LOS - 1   Chief Complaint  Patient presents with  . Failure To Thrive       Admission HPI/Brief narrative: 81 yo female with htn, hyperlipidemia, CHF , Pafib (CHADS2=3), protein calorie malnutrition, apparently recently discharged recently discharged from any pain on 04/18/15 secondary to dysphagia, failure to thrive, and osteomyelitis with pressure ulcer, presents with poor appetite, and decreased oral intake  Subjective:   Melanie Byrd todayis lethargic , can't provide any review of systems . Assessment & Plan    Active Problems:   CKD (chronic kidney disease), stage II   Sacral ulcer   Failure to thrive (0-17)   Anemia  Failure to thrive -  should continue to have progressive decline of her cognitive and functional capacity , and this is multifactorial daily to dementia , as well malnutrition given her dysphagia , osteomyelitis and dehydration . - Had Fifer discussion with daughter about overall poor prognosis, and goals of care, patient is anticipated to continue have progressive decline, palliative care consult requested to assist with goals of care.  pressure ulcer with osteomyelitis  - Unstageable , status post bedside debridement previous hospitalization . - Consult wound care , continue with IV Levaquin   Dehydration  - Continue with IV fluids   Dysphagia  - We'll consult SLP , has been evaluated during previous hospitalization , her current dysphagia is largely secondary to cognitive impairment .   chronic kidney disease stage III  - Stable   Hypertension  - Blood pressure is acceptable , keep on when necessary IV hydralazine , patient is nothing by mouth and will be  able to take her oral medications .   atrial fibrillation - Resume Eliquis patient is able to tolerate oral intake. - Follow on 2-D echo for murmur   Code Status: DNR,   Family Communication: spoke with daughter at bedside  Disposition Plan: SNF   Procedures  none   Consults   Requested palliative   Medications  Scheduled Meds: . amLODipine  5 mg Oral Daily  . antiseptic oral rinse  7 mL Mouth Rinse BID  . apixaban  2.5 mg Oral BID  . atorvastatin  20 mg Oral QPM  . cholecalciferol  1,000 Units Oral q morning - 10a  . docusate sodium  100 mg Oral BID  . escitalopram  10 mg Oral Daily  . feeding supplement (PRO-STAT 64)  30 mL Oral BID  . folic acid  1 mg Oral q morning - 10a  . gabapentin  100 mg Oral TID  . [START ON 04/22/2015] levofloxacin (LEVAQUIN) IV  500 mg Intravenous Q24H  . metoprolol succinate  50 mg Oral QHS  . sodium chloride  3 mL Intravenous Q12H  . traMADol  50 mg Oral QHS  . triamterene-hydrochlorothiazide  0.5 tablet Oral QODAY   Continuous Infusions: . 0.9 % NaCl with KCl 20 mEq / L     PRN Meds:.acetaminophen **OR** acetaminophen, amitriptyline, hydrALAZINE, RESOURCE THICKENUP CLEAR  DVT Prophylaxis  On Eliquis  Lab Results  Component Value Date   PLT 387 04/21/2015    Antibiotics    Anti-infectives    Start     Dose/Rate Route Frequency Ordered Stop   04/22/15 2200  levofloxacin (LEVAQUIN) IVPB 500 mg     500 mg 100 mL/hr over 60 Minutes Intravenous Every 24 hours 04/21/15 0834     04/21/15 0100  ciprofloxacin (CIPRO) tablet 500 mg  Status:  Discontinued     500 mg Oral Every 12 hours 04/21/15 0008 04/21/15 0056   04/20/15 2330  levofloxacin (LEVAQUIN) IVPB 750 mg     750 mg 100 mL/hr over 90 Minutes Intravenous  Once 04/20/15 2315 04/21/15 0107          Objective:   Filed Vitals:   04/20/15 2330 04/21/15 0010 04/21/15 0040 04/21/15 0657  BP: 154/82 142/76  145/72  Pulse: 100 98  97  Temp:  98.1 F (36.7 C)  98.7 F  (37.1 C)  TempSrc:  Axillary  Oral  Resp: 21 18  16   Height:   5\' 5"  (1.651 m)   Weight:   49.4 kg (108 lb 14.5 oz) 49.4 kg (108 lb 14.5 oz)  SpO2: 100% 94%  94%    Wt Readings from Last 3 Encounters:  04/21/15 49.4 kg (108 lb 14.5 oz)  04/14/15 51.665 kg (113 lb 14.4 oz)  04/15/15 51.256 kg (113 lb)     Intake/Output Summary (Last 24 hours) at 04/21/15 0945 Last data filed at 04/21/15 0500  Gross per 24 hour  Intake    300 ml  Output      0 ml  Net    300 ml     Physical Exam   lethargic, open her eyes to loud verbal stimuli  supple Neck,No JVD,  Symmetrical Chest wall movement, fair air movement bilaterally,  RRR,No Gallops,Rubs ,+murmur, No Parasternal Heave +ve B.Sounds, Abd Soft, No tenderness, No organomegaly appriciated,  No Cyanosis, Clubbing or edema,  sacral pressure ulcer   Micro Results Recent Results (from the past 240 hour(s))  Urine culture     Status: None   Collection Time: 04/14/15  1:30 PM  Result Value Ref Range Status   Specimen Description URINE, CATHETERIZED  Final   Special Requests NONE  Final   Culture   Final    NO GROWTH 2 DAYS [facility]    Report Status 04/16/2015 FINAL  Final  Culture, blood (routine x 2)     Status: None   Collection Time: 04/14/15  1:32 PM  Result Value Ref Range Status   Specimen Description BLOOD RIGHT HAND  Final   Special Requests BOTTLES DRAWN AEROBIC ONLY 8CC  Final   Culture NO GROWTH 5 DAYS  Final   Report Status 04/19/2015 FINAL  Final  Culture, blood (routine x 2)     Status: None   Collection Time: 04/14/15  1:33 PM  Result Value Ref Range Status   Specimen Description BLOOD LEFT HAND  Final   Special Requests   Final    BOTTLES DRAWN AEROBIC AND ANAEROBIC AEB 6CC ANA 6CC   Culture NO GROWTH 5 DAYS  Final   Report Status 04/19/2015 FINAL  Final  Blood culture (routine x 2)     Status: None (Preliminary result)   Collection Time: 04/20/15  9:14 PM  Result Value Ref Range  Status   Specimen Description BLOOD LEFT HAND  Final   Special Requests BOTTLES DRAWN AEROBIC AND ANAEROBIC 8CC  Final  Culture PENDING  Incomplete   Report Status PENDING  Incomplete  Blood culture (routine x 2)     Status: None (Preliminary result)   Collection Time: 04/20/15  9:24 PM  Result Value Ref Range Status   Specimen Description BLOOD LEFT HAND  Final   Special Requests BOTTLES DRAWN AEROBIC AND ANAEROBIC [facility]  Final   Culture PENDING  Incomplete   Report Status PENDING  Incomplete    Radiology Reports Dg Chest 1 View  04/20/2015   CLINICAL DATA:  Patient was admitted to hospital last week for bed source with discharge on 04/18/2015. Patient is refusing to eat and has not eaten since Friday. Weakness.  EXAM: CHEST  1 VIEW  COMPARISON:  04/14/2015  FINDINGS: Normal heart size and pulmonary vascularity for technique. Slightly shallow inspiration. Linear infiltration or atelectasis demonstrated in the right lung base. This has progressed since previous study. No blunting of costophrenic angles. No pneumothorax. Calcified and tortuous aorta.  IMPRESSION: Shallow inspiration with linear infiltration or atelectasis in the right lung base.   Electronically Signed   By: Burman Nieves M.D.   On: 04/20/2015 22:20   Ct Head Wo Contrast  04/14/2015   CLINICAL DATA:  81 year old with weakness.  EXAM: CT HEAD WITHOUT CONTRAST  TECHNIQUE: Contiguous axial images were obtained from the base of the skull through the vertex without contrast.  COMPARISON:  03/08/2015  FINDINGS: Stable cerebral atrophy. Stable low-density in the periventricular white matter. No evidence for acute hemorrhage, mass lesion, midline shift, hydrocephalus or large infarct. No acute bone abnormality.  IMPRESSION: No acute intracranial abnormality.  Stable atrophy and white matter changes.   Electronically Signed   By: Richarda Overlie M.D.   On: 04/14/2015 14:20   Mr Sacrum/si Joints Wo Contrast  04/15/2015   CLINICAL DATA:   Decubitus ulcer.  Osteomyelitis of the sacrum.  EXAM: MR SACRUM WITHOUT CONTRAST  TECHNIQUE: Multiplanar, multisequence MR imaging was performed. No intravenous contrast was administered.  COMPARISON:  None.  FINDINGS: The study is degraded by motion artifact.Coccygeal osteomyelitis is present with bone marrow edema in the proximal coccygeal segments. There is a deep decubitus ulcer that extends from the RIGHT side of the gluteal cleft to the surface of the coccyx. There is loss of the normal posterior coccygeal cortex. These findings are best seen on the sagittal images. Phlegmon is present in the dorsal sacral soft tissues, representing a combination of subcutaneous edema and likely infectious myositis in the gluteus maximus muscles. There is a left-sided bladder diverticulum incidentally noted on the visible portions of the visceral pelvis.  IMPRESSION: Coccygeal osteomyelitis with deep decubitus ulcer extending to the dorsal coccygeal surface. Surrounding cellulitis a on infectious myositis without an abscess.   Electronically Signed   By: Andreas Newport M.D.   On: 04/15/2015 10:52   Dg Chest Port 1 View  04/14/2015   CLINICAL DATA:  Weakness.  Encephalopathy.  EXAM: PORTABLE CHEST - 1 VIEW  COMPARISON:  03/08/2015  FINDINGS: Patient rotated right. The Chin overlies the right apex. Normal heart size. No pleural effusion or pneumothorax. Low lung volumes with resultant pulmonary interstitial prominence. Mild bibasilar volume loss with probable atelectasis.  IMPRESSION: Bibasilar volume loss with patchy opacities, favored to represent atelectasis.   Electronically Signed   By: Jeronimo Greaves M.D.   On: 04/14/2015 14:07     CBC  Recent Labs Lab 04/14/15 1309  04/16/15 0652 04/17/15 4098 04/18/15 0628 04/20/15 2114 04/21/15 0632  WBC 18.5*  < >  13.7* 15.4* 12.0* 12.9* 9.9  HGB 14.1  < > 10.3* 10.6* 10.3* 11.4* 10.3*  HCT 42.2  < > 30.9* 32.1* 30.4* 33.8* 31.2*  PLT 240  < > 254 292 322 419* 387    MCV 87.9  < > 87.8 88.4 86.9 86.9 87.6  MCH 29.4  < > 29.3 29.2 29.4 29.3 28.9  MCHC 33.4  < > 33.3 33.0 33.9 33.7 33.0  RDW 14.4  < > 14.8 14.9 14.8 15.1 15.1  LYMPHSABS 1.7  --   --   --   --  2.1 1.6  MONOABS 1.2*  --   --   --   --  1.0 0.7  EOSABS 0.0  --   --   --   --  0.0 0.1  BASOSABS 0.0  --   --   --   --  0.0 0.0  < > = values in this interval not displayed.  Chemistries   Recent Labs Lab 04/14/15 1309 04/15/15 0628 04/16/15 1610 04/17/15 9604 04/18/15 0628 04/20/15 2114 04/21/15 0632  NA 133* 134* 136 140 143 144 143  K 4.1 3.5 3.6 3.4* 3.2* 3.3* 3.4*  CL 96* 103 105 108 111 110 110  CO2 26 23 24 22 22  20* 25  GLUCOSE 132* 122* 107* 144* 135* 132* 120*  BUN 23* 18 19 23* 23* 19 17  CREATININE 1.52* 1.17* 1.30* 1.54* 1.39* 1.16* 1.04*  CALCIUM 9.7 8.4* 8.9 9.3 9.5 9.7 9.2  MG  --   --   --  2.0  --   --   --   AST 35 26 29  --   --  32 28  ALT 14 11* 14  --   --  18 15  ALKPHOS 76 56 58  --   --  65 54  BILITOT 1.2 1.3* 1.5*  --   --  1.3* 0.9   ------------------------------------------------------------------------------------------------------------------ estimated creatinine clearance is 24.1 mL/min (by C-G formula based on Cr of 1.04). ------------------------------------------------------------------------------------------------------------------ No results for input(s): HGBA1C in the last 72 hours. ------------------------------------------------------------------------------------------------------------------ No results for input(s): CHOL, HDL, LDLCALC, TRIG, CHOLHDL, LDLDIRECT in the last 72 hours. ------------------------------------------------------------------------------------------------------------------  Recent Labs  04/21/15 0632  TSH 0.517   ------------------------------------------------------------------------------------------------------------------ No results for input(s): VITAMINB12, FOLATE, FERRITIN, TIBC, IRON, RETICCTPCT in  the last 72 hours.  Coagulation profile No results for input(s): INR, PROTIME in the last 168 hours.  No results for input(s): DDIMER in the last 72 hours.  Cardiac Enzymes  Recent Labs Lab 04/14/15 1309  TROPONINI 0.07*   ------------------------------------------------------------------------------------------------------------------ Invalid input(s): POCBNP     Time Spent in minutes   30 minutes   Aalijah Mims M.D on 04/21/2015 at 9:45 AM  Between 7am to 7pm - Pager - [phone]  After 7pm go to [email or web address] - password [facility]  [facility]   Office  [phone]

## 2015-04-21 NOTE — Progress Notes (Signed)
Present with Eber JonesCarolyn, patient's daughter, for emotional and spiritual support.Patient was quiet and resting during visit. She expressed thoughts about her grief due to her sister's recent death and the impact on both her mother and herself. Though she had hoped her mother would begin eating again and get some better when the patient briefly went home, Eber JonesCarolyn said she realized that was not happening. She said she felt Hospice would be helpful. We talked about the person her mother has been.We prayed together. Will continue to offer support.

## 2015-04-21 NOTE — Care Management Note (Signed)
Case Management Note  Patient Details  Name: Melanie CruzMaggie Simenson MRN: [id number] Date of Birth: 07/09/1916  Subjective/Objective:                  Pt readmitted from home with osteomylitis. Pt lives with her daughter and grandson and is active with [facility] RN, PT, and ST. Pt has a hospital bed, w/c, and hoyer lift. Pts [facility].  Action/Plan: Palliative care pending. CSW is aware that pts daughter is interested in Hospice. Will continue to follow for discharge planning needs.  Expected Discharge Date:                  Expected Discharge Plan:  Hospice Medical Facility  In-House Referral:  Clinical Social Work  Discharge planning Services  CM Consult  Post Acute Care Choice:  Hospice Choice offered to:  Adult Children  DME Arranged:    DME Agency:     HH Arranged:    HH Agency:     Status of Service:  In process, will continue to follow  Medicare Important Message Given:    Date Medicare IM Given:    Medicare IM give by:    Date Additional Medicare IM Given:    Additional Medicare Important Message give by:     If discussed at [place] Length of Stay Meetings, dates discussed:    Additional Comments:  Cheryl FlashBlackwell, Tykwon Fera Crowder, RN 04/21/2015, 12:50 PM

## 2015-04-21 NOTE — Progress Notes (Signed)
Initial Nutrition Assessment  DOCUMENTATION CODES:  Severe malnutrition in the context of chronic illness  Underweight  INTERVENTION:   Oral intake as pt desires  If more agressive approach is pursued then recommend: ST eval due to reported swallow difficulty  RD will follow for goals of care and implement appropriate nutrition interventions  NUTRITION DIAGNOSIS: Increased protein needs related to unstageable pressure ulcer as evidenced by estimated nutrient requirements.  GOAL:  Honor pt goals for healthcare and offer support and education.  MONITOR:  Results of palliative consult. Pt daughter is saying she is pursing end of life care at SNF.   REASON FOR ASSESSMENT: poor po's      ASSESSMENT: 81 y/o lady PMHx: CHF, HTN, Hypercholesterolemia, CHF. Chronic Stage IV pressure ulcer Daughter present on RD arrival.  Daughter says pt has not eaten in the past 9 days. She will take a bite of food and hold it in her mouth. She reports little to no fluid intake due to swallow difficulty. Daughter says she is not seeking aggressive nutrition measures.  Pt's primary caregiver (daughter died ~2 months ago) and this daughter has been taking care of pt since then. She is tearful and says she's not slept in her own bed for 8 days.   Pt has severe muscle depletions and loss of fat mass with poor oral intake (</= 75% for >/= 1 month).  Height: Ht Readings from Last 1 Encounters:  04/21/15 5\' 5"  (1.651 m)    Weight: Wt Readings from Last 1 Encounters:  04/21/15 108 lb 14.5 oz (49.4 kg)    Ideal Body Weight:   56.8 kg  Wt Readings from Last 10 Encounters:  04/21/15 108 lb 14.5 oz (49.4 kg)  04/14/15 113 lb 14.4 oz (51.665 kg)  04/15/15 113 lb (51.256 kg)  03/28/15 107 lb (48.535 kg)  03/08/15 135 lb (61.236 kg)  08/27/14 121 lb 3.2 oz (54.976 kg)    BMI:  Body mass index is 18.12 kg/(m^2). underweight   Estimated Nutritional Needs: Kcal:   1470-1715 (if aggressive therapy)   Protein:   75 gr Fluid:   >1500 ml daily  Skin: Unstageable Pressure Ulcer to sacrum. Recent MRI shows osteomyelitis of sacrum  Diet Order:  NPO   EDUCATION NEEDS: none at this time. Will provide as needed once care goals are established    Intake/Output Summary (Last 24 hours) at 04/21/15 1210 Last data filed at 04/21/15 0800  Gross per 24 hour  Intake    300 ml  Output      0 ml  Net    300 ml    Last BM: 7/7 loose  Royann ShiversLynn Hrithik Boschee MS,RD,CSG,LDN Office: [phone] Pager: [phone]

## 2015-04-21 NOTE — Progress Notes (Signed)
PT Cancellation Note  Patient Details Name: Melanie CruzMaggie Byrd MRN: [id number] DOB: 04/26/1916   Cancelled Treatment:    Reason Eval/Treat Not Completed: Other (comment).  Palliative care is consulted due to poor medical status.  We will defer PT consult until pt has been evaluated by them and a plan has been determined.  This will help to determine if PT is appropriate for this pt.   Myrlene BrokerBrown, Tovah Slavick L 04/21/2015, 11:40 AM

## 2015-04-22 LAB — BASIC METABOLIC PANEL
ANION GAP: 10 (ref 5–15)
BUN: 15 mg/dL (ref 6–20)
CALCIUM: 9.3 mg/dL (ref 8.9–10.3)
CO2: 24 mmol/L (ref 22–32)
Chloride: 109 mmol/L (ref 101–111)
Creatinine, Ser: 1.03 mg/dL — ABNORMAL HIGH (ref 0.44–1.00)
GFR calc Af Amer: 51 mL/min — ABNORMAL LOW (ref >60)
GFR, EST NON AFRICAN AMERICAN: 44 mL/min — AB (ref >60)
Glucose, Bld: 101 mg/dL — ABNORMAL HIGH (ref 65–99)
POTASSIUM: 3.7 mmol/L (ref 3.5–5.1)
Sodium: 143 mmol/L (ref 135–145)

## 2015-04-22 LAB — HEMOGLOBIN A1C
Hgb A1c MFr Bld: 6.8 % — ABNORMAL HIGH (ref 4.8–5.6)
MEAN PLASMA GLUCOSE: 148 mg/dL

## 2015-04-22 MED ORDER — ONDANSETRON 4 MG PO TBDP: 4.0000 mg | ORAL_TABLET | Freq: Four times a day (QID) | ORAL | Status: DC | PRN

## 2015-04-22 MED ORDER — LORAZEPAM 2 MG/ML PO CONC: 1.0000 mg | ORAL | Status: DC | PRN

## 2015-04-22 MED ORDER — LORAZEPAM 2 MG/ML IJ SOLN: 1.0000 mg | INTRAMUSCULAR | Status: DC | PRN

## 2015-04-22 MED ORDER — ONDANSETRON HCL 4 MG/2ML IJ SOLN: 4.0000 mg | Freq: Four times a day (QID) | INTRAMUSCULAR | Status: DC | PRN

## 2015-04-22 MED ORDER — MORPHINE SULFATE 2 MG/ML IJ SOLN: 1.0000 mg | INTRAMUSCULAR | Status: DC | PRN

## 2015-04-22 MED ORDER — LORAZEPAM 1 MG PO TABS: 1.0000 mg | ORAL_TABLET | ORAL | Status: DC | PRN

## 2015-04-22 MED ORDER — ACETAMINOPHEN 650 MG RE SUPP: 650.0000 mg | Freq: Four times a day (QID) | RECTAL | Status: DC | PRN

## 2015-04-22 MED ORDER — ACETAMINOPHEN 325 MG PO TABS: 650.0000 mg | ORAL_TABLET | Freq: Four times a day (QID) | ORAL | Status: DC | PRN

## 2015-04-22 MED ORDER — ATROPINE SULFATE 1 % OP SOLN
4.0000 [drp] | OPHTHALMIC | Status: DC | PRN
  Filled 2015-04-22: qty 2

## 2015-04-22 NOTE — Progress Notes (Signed)
Patient Demographics  Melanie Byrd, is a 81 y.o. female, DOB - 03/03/1916, ZOX:[id number]  Admit date - 04/20/2015   Admitting Physician Pearson GrippeJames Kim, MD  Outpatient Primary MD for the patient is VASIREDDY,SABITHA, MD  LOS - 2   Chief Complaint  Patient presents with  . Failure To Thrive       Admission HPI/Brief narrative: 81 yo female with htn, hyperlipidemia, CHF , Pafib (CHADS2=3), protein calorie malnutrition, apparently recently discharged recently discharged from any pain on 04/18/15 secondary to dysphagia, failure to thrive, and osteomyelitis with pressure ulcer, presents with poor appetite, and decreased oral intake  Subjective:   [name] todayis lethargic , can't provide any review of systems . Assessment & Plan    Active Problems:   CKD (chronic kidney disease), stage II   Sacral ulcer   Failure to thrive (0-17)   Anemia  Failure to thrive -  should continue to have progressive decline of her cognitive and functional capacity , and this is multifactorial daily to dementia , as well malnutrition given her dysphagia , osteomyelitis and dehydration . - Discussed with daughter about overall poor prognosis, and goals of care, patient is anticipated to continue have progressive decline, palliative care consult requested to assist with goals of care. Discussed with daughter and multiple family members, plan is to discharge to home hospice home, our main goal is comfort care at this point.  pressure ulcer with osteomyelitis  - Unstageable , status post bedside debridement previous hospitalization .    Dehydration  - To discharge for hospice home.  Dysphagia  - her current dysphagia is largely secondary to cognitive impairment . They Recommend continued diet order with a focus on NTL, ice cream ok,  Magic Cups as pt responded best to cold temperature. OK for ice chips if she is able to  tolerate.   chronic kidney disease stage III  - Stable , no further labs  Hypertension  - Unable to take oral.   atrial fibrillation - Unable to take orals   Code Status: DNR, at this point plan is to discharge [facility] when bed is available,no  aggressive measures, patient is comfort care.  Family Communication: spoke with daughter a few family members at bedside  Disposition Plan: SNF   Procedures  none   Consults   Requested palliative   Medications  Scheduled Meds: . amLODipine  5 mg Oral Daily  . antiseptic oral rinse  7 mL Mouth Rinse BID  . apixaban  2.5 mg Oral BID  . atorvastatin  20 mg Oral QPM  . cholecalciferol  1,000 Units Oral q morning - 10a  . collagenase   Topical Daily  . docusate sodium  100 mg Oral BID  . escitalopram  10 mg Oral Daily  . feeding supplement (PRO-STAT 64)  30 mL Oral BID  . folic acid  1 mg Oral q morning - 10a  . gabapentin  100 mg Oral TID  . levofloxacin (LEVAQUIN) IV  500 mg Intravenous Q48H  . metoprolol succinate  50 mg Oral QHS  . sodium chloride  3 mL Intravenous Q12H  . traMADol  50 mg Oral QHS   Continuous Infusions: . 0.9 % NaCl with KCl 20 mEq /  L 50 mL/hr at 04/21/15 2224   PRN Meds:.acetaminophen **OR** acetaminophen, amitriptyline, hydrALAZINE, RESOURCE THICKENUP CLEAR  DVT Prophylaxis  On Eliquis  Lab Results  Component Value Date   PLT 387 04/21/2015    Antibiotics    Anti-infectives    Start     Dose/Rate Route Frequency Ordered Stop   04/22/15 2200  levofloxacin (LEVAQUIN) IVPB 500 mg  Status:  Discontinued     500 mg 100 mL/hr over 60 Minutes Intravenous Every 24 hours 04/21/15 0834 04/21/15 1405   04/22/15 2200  levofloxacin (LEVAQUIN) IVPB 500 mg     500 mg 100 mL/hr over 60 Minutes Intravenous Every 48 hours 04/21/15 1405     04/21/15 0100  ciprofloxacin (CIPRO) tablet 500 mg  Status:  Discontinued     500 mg Oral Every 12 hours 04/21/15 0008 04/21/15 0056   04/20/15 2330   levofloxacin (LEVAQUIN) IVPB 750 mg     750 mg 100 mL/hr over 90 Minutes Intravenous  Once 04/20/15 2315 04/21/15 0107          Objective:   Filed Vitals:   04/21/15 0657 04/21/15 1500 04/21/15 2145 04/22/15 0637  BP: 145/72 141/81 151/57 143/80  Pulse: 97 91 84 76  Temp: 98.7 F (37.1 C) 98.5 F (36.9 C) 98.3 F (36.8 C) 98.2 F (36.8 C)  TempSrc: Oral Oral Oral Oral  Resp: 16 16 16 16   Height:      Weight: 49.4 kg (108 lb 14.5 oz)   48.3 kg (106 lb 7.7 oz)  SpO2: 94% 95% 100% 97%    Wt Readings from Last 3 Encounters:  04/22/15 48.3 kg (106 lb 7.7 oz)  04/14/15 51.665 kg (113 lb 14.4 oz)  04/15/15 51.256 kg (113 lb)     Intake/Output Summary (Last 24 hours) at 04/22/15 1212 Last data filed at 04/21/15 2000  Gross per 24 hour  Intake  482.5 ml  Output      0 ml  Net  482.5 ml     Physical Exam   lethargic, open her eyes to loud verbal stimuli  supple Neck,No JVD,  Symmetrical Chest wall movement, fair air movement bilaterally,  RRR,No Gallops,Rubs ,+murmur, No Parasternal Heave +ve B.Sounds, Abd Soft, No tenderness, No organomegaly appriciated,  No Cyanosis, Clubbing or edema,  sacral pressure ulcer   Micro Results Recent Results (from the past 240 hour(s))  Urine culture     Status: None   Collection Time: 04/14/15  1:30 PM  Result Value Ref Range Status   Specimen Description URINE, CATHETERIZED  Final   Special Requests NONE  Final   Culture   Final    NO GROWTH 2 DAYS Performed at [facility]    Report Status 04/16/2015 FINAL  Final  Culture, blood (routine x 2)     Status: None   Collection Time: 04/14/15  1:32 PM  Result Value Ref Range Status   Specimen Description BLOOD RIGHT HAND  Final   Special Requests BOTTLES DRAWN AEROBIC ONLY 8CC  Final   Culture NO GROWTH 5 DAYS  Final   Report Status 04/19/2015 FINAL  Final  Culture, blood (routine x 2)     Status: None   Collection Time: 04/14/15  1:33 PM  Result Value Ref Range  Status   Specimen Description BLOOD LEFT HAND  Final   Special Requests   Final    BOTTLES DRAWN AEROBIC AND ANAEROBIC AEB 6CC ANA 6CC   Culture NO GROWTH 5 DAYS  Final  Report Status 04/19/2015 FINAL  Final  Blood culture (routine x 2)     Status: None (Preliminary result)   Collection Time: 04/20/15  9:14 PM  Result Value Ref Range Status   Specimen Description BLOOD LEFT HAND  Final   Special Requests BOTTLES DRAWN AEROBIC AND ANAEROBIC 8CC  Final   Culture PENDING  Incomplete   Report Status PENDING  Incomplete  Blood culture (routine x 2)     Status: None (Preliminary result)   Collection Time: 04/20/15  9:24 PM  Result Value Ref Range Status   Specimen Description BLOOD LEFT HAND  Final   Special Requests BOTTLES DRAWN AEROBIC AND ANAEROBIC 6CC  Final   Culture NO GROWTH 1 DAY  Final   Report Status PENDING  Incomplete  Wound culture     Status: None (Preliminary result)   Collection Time: 04/21/15  1:30 PM  Result Value Ref Range Status   Specimen Description SACRAL  Final   Special Requests Normal  Final   Gram Stain   Final    FEW WBC PRESENT, PREDOMINANTLY MONONUCLEAR NO SQUAMOUS EPITHELIAL CELLS SEEN NO ORGANISMS SEEN Performed at [facility]    Culture NO GROWTH Performed at [facility]   Final   Report Status PENDING  Incomplete    Radiology Reports Dg Chest 1 View  04/20/2015   CLINICAL DATA:  Patient was admitted to hospital last week for bed source with discharge on 04/18/2015. Patient is refusing to eat and has not eaten since Friday. Weakness.  EXAM: CHEST  1 VIEW  COMPARISON:  04/14/2015  FINDINGS: Normal heart size and pulmonary vascularity for technique. Slightly shallow inspiration. Linear infiltration or atelectasis demonstrated in the right lung base. This has progressed since previous study. No blunting of costophrenic angles. No pneumothorax. Calcified and tortuous aorta.  IMPRESSION: Shallow inspiration with linear infiltration or  atelectasis in the right lung base.   Electronically Signed   By: Burman Nieves M.D.   On: 04/20/2015 22:20   Ct Head Wo Contrast  04/14/2015   CLINICAL DATA:  81 year old with weakness.  EXAM: CT HEAD WITHOUT CONTRAST  TECHNIQUE: Contiguous axial images were obtained from the base of the skull through the vertex without contrast.  COMPARISON:  03/08/2015  FINDINGS: Stable cerebral atrophy. Stable low-density in the periventricular white matter. No evidence for acute hemorrhage, mass lesion, midline shift, hydrocephalus or large infarct. No acute bone abnormality.  IMPRESSION: No acute intracranial abnormality.  Stable atrophy and white matter changes.   Electronically Signed   By: Richarda Overlie M.D.   On: 04/14/2015 14:20   Mr Sacrum/si Joints Wo Contrast  04/15/2015   CLINICAL DATA:  Decubitus ulcer.  Osteomyelitis of the sacrum.  EXAM: MR SACRUM WITHOUT CONTRAST  TECHNIQUE: Multiplanar, multisequence MR imaging was performed. No intravenous contrast was administered.  COMPARISON:  None.  FINDINGS: The study is degraded by motion artifact.Coccygeal osteomyelitis is present with bone marrow edema in the proximal coccygeal segments. There is a deep decubitus ulcer that extends from the RIGHT side of the gluteal cleft to the surface of the coccyx. There is loss of the normal posterior coccygeal cortex. These findings are best seen on the sagittal images. Phlegmon is present in the dorsal sacral soft tissues, representing a combination of subcutaneous edema and likely infectious myositis in the gluteus maximus muscles. There is a left-sided bladder diverticulum incidentally noted on the visible portions of the visceral pelvis.  IMPRESSION: Coccygeal osteomyelitis with deep decubitus ulcer extending  to the dorsal coccygeal surface. Surrounding cellulitis a on infectious myositis without an abscess.   Electronically Signed   By: Andreas Newport M.D.   On: 04/15/2015 10:52   Dg Chest Port 1 View  04/14/2015    CLINICAL DATA:  Weakness.  Encephalopathy.  EXAM: PORTABLE CHEST - 1 VIEW  COMPARISON:  03/08/2015  FINDINGS: Patient rotated right. The Chin overlies the right apex. Normal heart size. No pleural effusion or pneumothorax. Low lung volumes with resultant pulmonary interstitial prominence. Mild bibasilar volume loss with probable atelectasis.  IMPRESSION: Bibasilar volume loss with patchy opacities, favored to represent atelectasis.   Electronically Signed   By: Jeronimo Greaves M.D.   On: 04/14/2015 14:07     CBC  Recent Labs Lab 04/16/15 1610 04/17/15 0638 04/18/15 0628 04/20/15 2114 04/21/15 0632  WBC 13.7* 15.4* 12.0* 12.9* 9.9  HGB 10.3* 10.6* 10.3* 11.4* 10.3*  HCT 30.9* 32.1* 30.4* 33.8* 31.2*  PLT 254 292 322 419* 387  MCV 87.8 88.4 86.9 86.9 87.6  MCH 29.3 29.2 29.4 29.3 28.9  MCHC 33.3 33.0 33.9 33.7 33.0  RDW 14.8 14.9 14.8 15.1 15.1  LYMPHSABS  --   --   --  2.1 1.6  MONOABS  --   --   --  1.0 0.7  EOSABS  --   --   --  0.0 0.1  BASOSABS  --   --   --  0.0 0.0    Chemistries   Recent Labs Lab 04/16/15 0652 04/17/15 0638 04/18/15 0628 04/20/15 2114 04/21/15 0632 04/22/15 0641  NA 136 140 143 144 143 143  K 3.6 3.4* 3.2* 3.3* 3.4* 3.7  CL 105 108 111 110 110 109  CO2 24 22 22  20* 25 24  GLUCOSE 107* 144* 135* 132* 120* 101*  BUN 19 23* 23* 19 17 15   CREATININE 1.30* 1.54* 1.39* 1.16* 1.04* 1.03*  CALCIUM 8.9 9.3 9.5 9.7 9.2 9.3  MG  --  2.0  --   --   --   --   AST 29  --   --  32 28  --   ALT 14  --   --  18 15  --   ALKPHOS 58  --   --  65 54  --   BILITOT 1.5*  --   --  1.3* 0.9  --    ------------------------------------------------------------------------------------------------------------------ estimated creatinine clearance is 23.8 mL/min (by C-G formula based on Cr of 1.03). ------------------------------------------------------------------------------------------------------------------  Recent Labs  04/21/15 0632  HGBA1C 6.8*    ------------------------------------------------------------------------------------------------------------------ No results for input(s): CHOL, HDL, LDLCALC, TRIG, CHOLHDL, LDLDIRECT in the last 72 hours. ------------------------------------------------------------------------------------------------------------------  Recent Labs  04/21/15 0632  TSH 0.517   ------------------------------------------------------------------------------------------------------------------ No results for input(s): VITAMINB12, FOLATE, FERRITIN, TIBC, IRON, RETICCTPCT in the last 72 hours.  Coagulation profile No results for input(s): INR, PROTIME in the last 168 hours.  No results for input(s): DDIMER in the last 72 hours.  Cardiac Enzymes No results for input(s): CKMB, TROPONINI, MYOGLOBIN in the last 168 hours.  Invalid input(s): CK ------------------------------------------------------------------------------------------------------------------ Invalid input(s): POCBNP     Time Spent in minutes   30 minutes   Janeisha Ryle M.D on 04/22/2015 at 12:12 PM  Between 7am to 7pm - Pager - [phone]  After 7pm go to [email or web address] - password [facility]  [facility]   Office  [phone]

## 2015-04-22 NOTE — Progress Notes (Signed)
PT Cancellation Note  Patient Details Name: Melanie CruzMaggie Wan MRN: [id number] DOB: 06/07/1916   Cancelled Treatment:    Reason Eval/Treat Not Completed: Other (comment).  Per MD note of today, pt is in a decline and is expected to discharge to the Hospice Home.  PT is no longer appropriate.  We will d/c our current orders.  If status changes, please reconsult.   Myrlene BrokerBrown, Fraser Busche L 04/22/2015, 3:07 PM

## 2015-04-22 NOTE — Evaluation (Signed)
Clinical/Bedside Swallow Evaluation Patient Details  Name: Melanie Byrd MRN: [id number] Date of Birth: [birth year]  Today's Date: 04/22/2015 Time: SLP Start Time (ACUTE ONLY): 1800 SLP Stop Time (ACUTE ONLY): 1834 SLP Time Calculation (min) (ACUTE ONLY): 34 min  Past Medical History:  Past Medical History  Diagnosis Date  . Hypertension   . Hypercholesterolemia   . Renal disorder   . CHF (congestive heart failure)   . Arthritis   . Pressure ulcer     unstageable coccyx and sacrum with eschar  . Osteomyelitis     coccyx  . Myositis   . CKD (chronic kidney disease)   . A-fib     on eliquis for years per family  . CAD (coronary artery disease) 11/2013   Past Surgical History:  Past Surgical History  Procedure Laterality Date  . Hernia repair      about 40years ago    HPI:  81 yo female with htn, hyperlipidemia, CHF , Pafib (CHADS2=3), protein calorie malnutrition, apparently recently discharged [facility] secondary to dysphagia, failure to thrive, and osteomyelitis with pressure ulcer, presents with poor appetite, and decreased oral intake. Family brought pt back to [facility] due to decreased oral intake.   Assessment / Plan / Recommendation Clinical Impression  MD asked SLP to see pt during this stay for family education. Pt currently with FTT, but agreeable to ice chips. Pt took ~5 ice chips over 10 minutes and verbalized when she had enough. I spoke with pt's daughter briefly before she went home, but spoke more with pt's grandson. He stated that he felt confused because he thought she was unable to swallow, but was swallowing ice chips well now. SLP explained that physically, her swallowing was ok, but cognitively (and perhaps volitionally) her swallow was impacted. SLP emphasized comfort over pushing po and following pt's lead. Recommend comfort feeds for pleasure only; pt will currently verbally state whether she wants something or not. Grandson encouraged to  offer ice chips periodically when pt semi-upright and willing. He verbalized understanding and acceptance. Would consider offering ice chips, ice cream, and magic cups if pt agreeable. No further SLP services indicated at this time. Pt likely to D/C to hospice home when bed available per daughter.    Aspiration Risk  Moderate    Diet Recommendation Ice chips PRN after oral care (comfort feeds only)   Medication Administration: Crushed with puree (if pt accepting)    Other  Recommendations Oral Care Recommendations: Oral care QID;Staff/trained caregiver to provide oral care   Follow Up Recommendations       Frequency and Duration            Swallow Study Prior Functional Status       General Date of Onset: 04/14/15 Other Pertinent Information: 81 yo female with htn, hyperlipidemia, CHF , Pafib (CHADS2=3), protein calorie malnutrition, apparently recently discharged recently discharged from [facility] on 04/18/15 secondary to dysphagia, failure to thrive, and osteomyelitis with pressure ulcer, presents with poor appetite, and decreased oral intake. Family brought pt back to [facility] due to decreased oral intake. Type of Study: Bedside swallow evaluation Previous Swallow Assessment: last week Diet Prior to this Study: NPO Temperature Spikes Noted: No Respiratory Status: Room air History of Recent Intubation: No Behavior/Cognition: Lethargic/Drowsy;Requires cueing Self-Feeding Abilities: Total assist Patient Positioning: Upright in bed Baseline Vocal Quality: Normal Volitional Cough: Weak Volitional Swallow: Unable to elicit    Oral/Motor/Sensory Function Overall Oral Motor/Sensory Function: Appears within functional limits for tasks  assessed   Ice Chips Ice chips: Within functional limits Presentation: Spoon   Thin Liquid Thin Liquid: Not tested (pt declined)    Nectar Thick Nectar Thick Liquid: Not tested   Honey Thick Honey Thick Liquid: Not tested   Puree     Solid   Thank  you,  Havery MorosDabney Porter, CCC-SLP [phone]     Solid: Not tested       PORTER,DABNEY 04/22/2015,6:44 PM

## 2015-04-23 ENCOUNTER — Inpatient Hospital Stay (HOSPITAL_COMMUNITY): Admission: RE | Admit: 2015-04-23 | Source: Ambulatory Visit | Admitting: Family Medicine

## 2015-04-23 ENCOUNTER — Encounter (HOSPITAL_COMMUNITY): Payer: Self-pay | Admitting: Primary Care

## 2015-04-23 ENCOUNTER — Encounter (HOSPITAL_COMMUNITY): Payer: Self-pay | Admitting: *Deleted

## 2015-04-23 ENCOUNTER — Inpatient Hospital Stay (HOSPITAL_COMMUNITY)
Admission: AD | Admit: 2015-04-23 | Discharge: 2015-04-24 | DRG: 641 | Disposition: A | Discharge status: Hospice | Source: Hospice | Attending: Internal Medicine | Admitting: Internal Medicine

## 2015-04-23 DIAGNOSIS — M4628 Osteomyelitis of vertebra, sacral and sacrococcygeal region: Secondary | ICD-10-CM | POA: Diagnosis present

## 2015-04-23 DIAGNOSIS — E780 Pure hypercholesterolemia: Secondary | ICD-10-CM | POA: Diagnosis present

## 2015-04-23 DIAGNOSIS — R1310 Dysphagia, unspecified: Secondary | ICD-10-CM | POA: Diagnosis present

## 2015-04-23 DIAGNOSIS — F0390 Unspecified dementia without behavioral disturbance: Secondary | ICD-10-CM | POA: Diagnosis present

## 2015-04-23 DIAGNOSIS — M1990 Unspecified osteoarthritis, unspecified site: Secondary | ICD-10-CM | POA: Diagnosis present

## 2015-04-23 DIAGNOSIS — R627 Adult failure to thrive: Secondary | ICD-10-CM | POA: Diagnosis present

## 2015-04-23 DIAGNOSIS — N183 Chronic kidney disease, stage 3 (moderate): Secondary | ICD-10-CM | POA: Diagnosis present

## 2015-04-23 DIAGNOSIS — E46 Unspecified protein-calorie malnutrition: Secondary | ICD-10-CM | POA: Diagnosis present

## 2015-04-23 DIAGNOSIS — M869 Osteomyelitis, unspecified: Secondary | ICD-10-CM | POA: Diagnosis present

## 2015-04-23 DIAGNOSIS — I480 Paroxysmal atrial fibrillation: Secondary | ICD-10-CM | POA: Diagnosis present

## 2015-04-23 DIAGNOSIS — E785 Hyperlipidemia, unspecified: Secondary | ICD-10-CM | POA: Diagnosis present

## 2015-04-23 DIAGNOSIS — I509 Heart failure, unspecified: Secondary | ICD-10-CM | POA: Diagnosis present

## 2015-04-23 DIAGNOSIS — Z515 Encounter for palliative care: Secondary | ICD-10-CM | POA: Diagnosis not present

## 2015-04-23 DIAGNOSIS — I2510 Atherosclerotic heart disease of native coronary artery without angina pectoris: Secondary | ICD-10-CM | POA: Diagnosis present

## 2015-04-23 DIAGNOSIS — I129 Hypertensive chronic kidney disease with stage 1 through stage 4 chronic kidney disease, or unspecified chronic kidney disease: Secondary | ICD-10-CM | POA: Diagnosis present

## 2015-04-23 DIAGNOSIS — L8990 Pressure ulcer of unspecified site, unspecified stage: Secondary | ICD-10-CM | POA: Diagnosis not present

## 2015-04-23 DIAGNOSIS — Z66 Do not resuscitate: Secondary | ICD-10-CM | POA: Diagnosis present

## 2015-04-23 DIAGNOSIS — E860 Dehydration: Principal | ICD-10-CM | POA: Diagnosis present

## 2015-04-23 DIAGNOSIS — L89150 Pressure ulcer of sacral region, unstageable: Secondary | ICD-10-CM | POA: Diagnosis present

## 2015-04-23 MED ORDER — MORPHINE SULFATE 2 MG/ML IJ SOLN: 1.0000 mg | INTRAMUSCULAR | Status: DC | PRN

## 2015-04-23 MED ORDER — LORAZEPAM 1 MG PO TABS: 1.0000 mg | ORAL_TABLET | ORAL | Status: DC | PRN

## 2015-04-23 MED ORDER — ACETAMINOPHEN 325 MG PO TABS: 650.0000 mg | ORAL_TABLET | Freq: Four times a day (QID) | ORAL | Status: DC | PRN

## 2015-04-23 MED ORDER — LORAZEPAM 2 MG/ML IJ SOLN: 1.0000 mg | INTRAMUSCULAR | Status: DC | PRN

## 2015-04-23 MED ORDER — ACETAMINOPHEN 650 MG RE SUPP: 650.0000 mg | Freq: Four times a day (QID) | RECTAL | Status: DC | PRN

## 2015-04-23 MED ORDER — RESOURCE THICKENUP CLEAR PO POWD
1.0000 | ORAL | Status: DC | PRN
Start: 2015-04-23 — End: 2015-04-24
  Filled 2015-04-23: qty 125

## 2015-04-23 MED ORDER — COLLAGENASE 250 UNIT/GM EX OINT
TOPICAL_OINTMENT | Freq: Every day | CUTANEOUS | Status: DC
  Administered 2015-04-24: 11:00:00 via TOPICAL
  Filled 2015-04-23: qty 30

## 2015-04-23 MED ORDER — SODIUM CHLORIDE 0.9 % IJ SOLN
3.0000 mL | Freq: Two times a day (BID) | INTRAMUSCULAR | Status: DC
  Administered 2015-04-23 – 2015-04-24 (×2): 3 mL via INTRAVENOUS

## 2015-04-23 MED ORDER — SODIUM CHLORIDE 0.9 % IV SOLN: 250.0000 mL | INTRAVENOUS | Status: DC | PRN

## 2015-04-23 MED ORDER — SODIUM CHLORIDE 0.9 % IJ SOLN: 3.0000 mL | INTRAMUSCULAR | Status: DC | PRN

## 2015-04-23 MED ORDER — CETYLPYRIDINIUM CHLORIDE 0.05 % MT LIQD
7.0000 mL | Freq: Two times a day (BID) | OROMUCOSAL | Status: DC
  Administered 2015-04-23 – 2015-04-24 (×2): 7 mL via OROMUCOSAL

## 2015-04-23 MED ORDER — ONDANSETRON 4 MG PO TBDP: 4.0000 mg | ORAL_TABLET | Freq: Four times a day (QID) | ORAL | Status: DC | PRN

## 2015-04-23 MED ORDER — ATROPINE SULFATE 1 % OP SOLN
4.0000 [drp] | OPHTHALMIC | Status: DC | PRN
  Filled 2015-04-23: qty 2

## 2015-04-23 MED ORDER — ONDANSETRON HCL 4 MG/2ML IJ SOLN: 4.0000 mg | Freq: Four times a day (QID) | INTRAMUSCULAR | Status: DC | PRN

## 2015-04-23 NOTE — Care Management Important Message (Signed)
Important Message  Patient Details  Name: Melanie Byrd MRN: [id number] Date of Birth: 06/02/1916   Medicare Important Message Given:  Yes-second notification given    Cheryl FlashBlackwell, Alekhya Gravlin Crowder, RN 04/23/2015, 12:47 PMImportant Message  Patient Details  Name: Melanie Byrd MRN: [id number] Date of Birth: 01/26/1916   Medicare Important Message Given:  Yes-second notification given    Cheryl FlashBlackwell, Eileen Croswell Crowder, RN 04/23/2015, 12:47 PM

## 2015-04-23 NOTE — Clinical Social Work Note (Signed)
Pt is now fully comfort care. Hospice has evaluated pt and pt is appropriate for [facility] Family is aware that as [facility] as stable, [facility]. Pt to transition to GIP today.  Derenda FennelKara Aliviah Spain, LCSW [phone]

## 2015-04-23 NOTE — Discharge Summary (Signed)
Physician Discharge Summary  Melanie Byrd ZOX:[id number] DOB: Feb 13, 1916 DOA: (Not on file)  PCP: Craig Staggers, MD  Admit date: (Not on file) Discharge date: 04/23/2015  Time spent: 25* minutes  Recommendations for Outpatient Follow-up:  1. Patient discharged to hospice  Discharge Diagnoses:  Active Problems:   * No active hospital problems. *   Discharge Condition: Stable  Diet recommendation: *Comfort feeds  There were no vitals filed for this visit. History of present illness:  81 yo female with htn, hyperlipidemia, CHF , Pafib (CHADS2=3), protein calorie malnutrition, apparently recently discharged recently discharged from any pain on 04/18/15 secondary to dysphagia, failure to thrive, and osteomyelitis with pressure ulcer, presents with poor appetite, and decreased oral intake   Hospital Course:  Failure to thrive - should continue to have progressive decline of her cognitive and functional capacity , and this is multifactorial daily to dementia , as well malnutrition given her dysphagia , osteomyelitis and dehydration . - Dr Huey Bienenstock discussed   with daughter about overall poor prognosis, and goals of care, patient  anticipated to continue have progressive decline, palliative care consult requested to assist with goals of care. He discussed  with daughter and multiple family members, plan is to discharge to  hospice home,  goal  comfort care at this point.  pressure ulcer with osteomyelitis  - Unstageable , status post bedside debridement previous hospitalization .  Dehydration  - To discharge for hospice home.  Dysphagia  - her current dysphagia is largely secondary to cognitive impairment . They Recommend continued diet order with a focus on NTL, ice cream ok, Magic Cups as pt responded best to cold temperature. OK for ice chips if she is able to tolerate.  chronic kidney disease stage III  - Stable , no further labs  Hypertension  - Unable to take  oral.  atrial fibrillation - Unable to take orals  Procedures:  None  Consultations:  None  Discharge Exam: There were no vitals filed for this visit.  General: Appear in no acute distress Cardiovascular: S1S2 RRR Respiratory: Clear bilaterally  Discharge Instructions    Cannot display discharge medications since this is not an admission.  Allergies  Allergen Reactions  . Cephalexin Other (See Comments)    ams      The results of significant diagnostics from this hospitalization (including imaging, microbiology, ancillary and laboratory) are listed below for reference.    Significant Diagnostic Studies: Dg Chest 1 View  04/20/2015   CLINICAL DATA:  Patient was admitted to hospital last week for bed source with discharge on 04/18/2015. Patient is refusing to eat and has not eaten since Friday. Weakness.  EXAM: CHEST  1 VIEW  COMPARISON:  04/14/2015  FINDINGS: Normal heart size and pulmonary vascularity for technique. Slightly shallow inspiration. Linear infiltration or atelectasis demonstrated in the right lung base. This has progressed since previous study. No blunting of costophrenic angles. No pneumothorax. Calcified and tortuous aorta.  IMPRESSION: Shallow inspiration with linear infiltration or atelectasis in the right lung base.   Electronically Signed   By: Burman Nieves M.D.   On: 04/20/2015 22:20   Ct Head Wo Contrast  04/14/2015   CLINICAL DATA:  81 year old with weakness.  EXAM: CT HEAD WITHOUT CONTRAST  TECHNIQUE: Contiguous axial images were obtained from the base of the skull through the vertex without contrast.  COMPARISON:  03/08/2015  FINDINGS: Stable cerebral atrophy. Stable low-density in the periventricular white matter. No evidence for acute hemorrhage, mass lesion, midline shift, hydrocephalus  or large infarct. No acute bone abnormality.  IMPRESSION: No acute intracranial abnormality.  Stable atrophy and white matter changes.   Electronically Signed    By: Richarda OverlieAdam  Henn M.D.   On: 04/14/2015 14:20   Mr Sacrum/si Joints Wo Contrast  04/15/2015   CLINICAL DATA:  Decubitus ulcer.  Osteomyelitis of the sacrum.  EXAM: MR SACRUM WITHOUT CONTRAST  TECHNIQUE: Multiplanar, multisequence MR imaging was performed. No intravenous contrast was administered.  COMPARISON:  None.  FINDINGS: The study is degraded by motion artifact.Coccygeal osteomyelitis is present with bone marrow edema in the proximal coccygeal segments. There is a deep decubitus ulcer that extends from the RIGHT side of the gluteal cleft to the surface of the coccyx. There is loss of the normal posterior coccygeal cortex. These findings are best seen on the sagittal images. Phlegmon is present in the dorsal sacral soft tissues, representing a combination of subcutaneous edema and likely infectious myositis in the gluteus maximus muscles. There is a left-sided bladder diverticulum incidentally noted on the visible portions of the visceral pelvis.  IMPRESSION: Coccygeal osteomyelitis with deep decubitus ulcer extending to the dorsal coccygeal surface. Surrounding cellulitis a on infectious myositis without an abscess.   Electronically Signed   By: Andreas NewportGeoffrey  Lamke M.D.   On: 04/15/2015 10:52   Dg Chest Port 1 View  04/14/2015   CLINICAL DATA:  Weakness.  Encephalopathy.  EXAM: PORTABLE CHEST - 1 VIEW  COMPARISON:  03/08/2015  FINDINGS: Patient rotated right. The Chin overlies the right apex. Normal heart size. No pleural effusion or pneumothorax. Low lung volumes with resultant pulmonary interstitial prominence. Mild bibasilar volume loss with probable atelectasis.  IMPRESSION: Bibasilar volume loss with patchy opacities, favored to represent atelectasis.   Electronically Signed   By: Jeronimo GreavesKyle  Talbot M.D.   On: 04/14/2015 14:07    Microbiology: Recent Results (from the past 240 hour(s))  Urine culture     Status: None   Collection Time: 04/14/15  1:30 PM  Result Value Ref Range Status   Specimen Description  URINE, CATHETERIZED  Final   Special Requests NONE  Final   Culture   Final    NO GROWTH 2 DAYS Performed [facility]    Report Status 04/16/2015 FINAL  Final  Culture, blood (routine x 2)     Status: None   Collection Time: 04/14/15  1:32 PM  Result Value Ref Range Status   Specimen Description BLOOD RIGHT HAND  Final   Special Requests BOTTLES DRAWN AEROBIC ONLY 8CC  Final   Culture NO GROWTH 5 DAYS  Final   Report Status 04/19/2015 FINAL  Final  Culture, blood (routine x 2)     Status: None   Collection Time: 04/14/15  1:33 PM  Result Value Ref Range Status   Specimen Description BLOOD LEFT HAND  Final   Special Requests   Final    BOTTLES DRAWN AEROBIC AND ANAEROBIC AEB 6CC ANA 6CC   Culture NO GROWTH 5 DAYS  Final   Report Status 04/19/2015 FINAL  Final  Blood culture (routine x 2)     Status: None (Preliminary result)   Collection Time: 04/20/15  9:14 PM  Result Value Ref Range Status   Specimen Description BLOOD LEFT HAND  Final   Special Requests BOTTLES DRAWN AEROBIC AND ANAEROBIC 8CC  Final   Culture PENDING  Incomplete   Report Status PENDING  Incomplete  Blood culture (routine x 2)     Status: None (Preliminary result)  Collection Time: 04/20/15  9:24 PM  Result Value Ref Range Status   Specimen Description BLOOD LEFT HAND  Final   Special Requests BOTTLES DRAWN AEROBIC AND ANAEROBIC 6CC  Final   Culture NO GROWTH 2 DAYS  Final   Report Status PENDING  Incomplete  Wound culture     Status: None (Preliminary result)   Collection Time: 04/21/15  1:30 PM  Result Value Ref Range Status   Specimen Description SACRAL  Final   Special Requests Normal  Final   Gram Stain   Final    FEW WBC PRESENT, PREDOMINANTLY MONONUCLEAR NO SQUAMOUS EPITHELIAL CELLS SEEN NO ORGANISMS SEEN Performed at [facility]    Culture   Final    MULTIPLE ORGANISMS PRESENT, NONE PREDOMINANT Performed at [facility]    Report Status PENDING  Incomplete      Labs: Basic Metabolic Panel:  Recent Labs Lab 04/17/15 0638 04/18/15 0628 04/20/15 2114 04/21/15 0632 04/22/15 0641  NA 140 143 144 143 143  K 3.4* 3.2* 3.3* 3.4* 3.7  CL 108 111 110 110 109  CO2 22 22 20* 25 24  GLUCOSE 144* 135* 132* 120* 101*  BUN 23* 23* 19 17 15   CREATININE 1.54* 1.39* 1.16* 1.04* 1.03*  CALCIUM 9.3 9.5 9.7 9.2 9.3  MG 2.0  --   --   --   --    Liver Function Tests:  Recent Labs Lab 04/20/15 2114 04/21/15 0632  AST 32 28  ALT 18 15  ALKPHOS 65 54  BILITOT 1.3* 0.9  PROT 6.4* 5.6*  ALBUMIN 2.4* 2.0*    Recent Labs Lab 04/20/15 2114  LIPASE 15*   No results for input(s): AMMONIA in the last 168 hours. CBC:  Recent Labs Lab 04/17/15 0638 04/18/15 0628 04/20/15 2114 04/21/15 0632  WBC 15.4* 12.0* 12.9* 9.9  NEUTROABS  --   --  9.8* 7.5  HGB 10.6* 10.3* 11.4* 10.3*  HCT 32.1* 30.4* 33.8* 31.2*  MCV 88.4 86.9 86.9 87.6  PLT 292 322 419* 387   Cardiac Enzymes: No results for input(s): CKTOTAL, CKMB, CKMBINDEX, TROPONINI in the last 168 hours. BNP: BNP (last 3 results) No results for input(s): BNP in the last 8760 hours.  ProBNP (last 3 results) No results for input(s): PROBNP in the last 8760 hours.  CBG: No results for input(s): GLUCAP in the last 168 hours.     SignedMauro Kaufmann S  [facility] 04/23/2015, 5:41 PM

## 2015-04-23 NOTE — H&P (Signed)
PCP:   VASIREDDY,SABITHA, MD   Chief Complaint:  Admit to hospice  HPI: 81 year old female who   has a past medical history of Hypertension; Hypercholesterolemia; Renal disorder; CHF (congestive heart failure); Arthritis; Pressure ulcer; Osteomyelitis; Myositis; CKD (chronic kidney disease); A-fib; and CAD (coronary artery disease) (11/2013). Patient was recently admitted to the hospital on 04/20/2015 for  failure to thrive, in the hospital patient continued to have progressive decline in the cognitive and functional capacity so goals of care meeting was done with the family and decision was made to make patient comfort care at this point. Patient was discharged from the hospital and has been admitted under hospice care. Patient has been lethargic and unable to provide any significant history.  Allergies:   Allergies  Allergen Reactions  . Cephalexin Other (See Comments)    ams      Past Medical History  Diagnosis Date  . Hypertension   . Hypercholesterolemia   . Renal disorder   . CHF (congestive heart failure)   . Arthritis   . Pressure ulcer     unstageable coccyx and sacrum with eschar  . Osteomyelitis     coccyx  . Myositis   . CKD (chronic kidney disease)   . A-fib     on eliquis for years per family  . CAD (coronary artery disease) 11/2013    Past Surgical History  Procedure Laterality Date  . Hernia repair      about 40years ago     Prior to Admission medications   Medication Sig Start Date End Date Taking? Authorizing Provider  acetaminophen (TYLENOL) 650 MG suppository Place 1 suppository (650 mg total) rectally every 6 (six) hours as needed for fever or moderate pain (rectal temp >100.5). 04/18/15   Gwenyth Bender, NP  Amino Acids-Protein Hydrolys (FEEDING SUPPLEMENT, PRO-STAT 64,) LIQD Take 30 mLs by mouth 2 (two) times daily at 10 AM and 5 PM. 04/18/15   Gwenyth Bender, NP  amitriptyline (ELAVIL) 10 MG tablet Take 10 mg by mouth at bedtime as needed for sleep.      Historical Provider, MD  amLODipine (NORVASC) 5 MG tablet Take 5 mg by mouth daily.    Historical Provider, MD  apixaban (ELIQUIS) 2.5 MG TABS tablet Take 2.5 mg by mouth 2 (two) times daily.    Historical Provider, MD  atorvastatin (LIPITOR) 20 MG tablet Take 20 mg by mouth every evening.    Historical Provider, MD  cholecalciferol (VITAMIN D) 1000 UNITS tablet Take 1,000 Units by mouth every morning.    Historical Provider, MD  ciprofloxacin (CIPRO) 500 MG tablet Take 1 tablet (500 mg total) by mouth 2 (two) times daily. 04/18/15   Gwenyth Bender, NP  collagenase (SANTYL) ointment Apply topically daily. 04/18/15   Gwenyth Bender, NP  docusate sodium (COLACE) 100 MG capsule Take 100 mg by mouth 2 (two) times daily.    Historical Provider, MD  escitalopram (LEXAPRO) 10 MG tablet Take 10 mg by mouth daily.    Historical Provider, MD  folic acid ([employer] FOLIC ACID) 800 MCG tablet Take 800 mcg by mouth every morning.    Historical Provider, MD  gabapentin (NEURONTIN) 100 MG capsule Take 100 mg by mouth 3 (three) times daily.    Historical Provider, MD  Maltodextrin-Xanthan Gum (RESOURCE THICKENUP CLEAR) POWD Take 120 g by mouth as needed (Nectar thick liquids). 04/18/15   Gwenyth Bender, NP  metoprolol succinate (TOPROL-XL) 50 MG 24 hr tablet Take 50 mg  by mouth at bedtime. Take with or immediately following a meal.    Historical Provider, MD  traMADol (ULTRAM) 50 MG tablet Take 50 mg by mouth at bedtime.    Historical Provider, MD  triamterene-hydrochlorothiazide (MAXZIDE-25) 37.5-25 MG per tablet Take 0.5 tablets by mouth every other day.    Historical Provider, MD     All the positives are listed in BOLD  Review of Systems:  Unable to obtain as patient is lethargic   Physical Exam: There were no vitals taken for this visit.  Head: Normocephalic and atraumatic Mouth: Mucus membranes moist Neck: Supple, No Thyromegaly Cardiovascular: RRR, S1 normal, S2 normal Pulmonary/Chest: CTAB, no wheezes,  rales, or rhonchi Abdominal: Soft. Non-tender, non-distended, bowel sounds are normal, no masses, organomegaly, or guarding present.  Neurological: Somnolent but arousable, moving all extremities  Extremities : No Cyanosis, Clubbing or Edema  Labs on Admission:  Basic Metabolic Panel:  Recent Labs Lab 04/17/15 0638 04/18/15 0628 04/20/15 2114 04/21/15 0632 04/22/15 0641  NA 140 143 144 143 143  K 3.4* 3.2* 3.3* 3.4* 3.7  CL 108 111 110 110 109  CO2 22 22 20* 25 24  GLUCOSE 144* 135* 132* 120* 101*  BUN 23* 23* 19 17 15   CREATININE 1.54* 1.39* 1.16* 1.04* 1.03*  CALCIUM 9.3 9.5 9.7 9.2 9.3  MG 2.0  --   --   --   --    Liver Function Tests:  Recent Labs Lab 04/20/15 2114 04/21/15 0632  AST 32 28  ALT 18 15  ALKPHOS 65 54  BILITOT 1.3* 0.9  PROT 6.4* 5.6*  ALBUMIN 2.4* 2.0*    Recent Labs Lab 04/20/15 2114  LIPASE 15*   No results for input(s): AMMONIA in the last 168 hours. CBC:  Recent Labs Lab 04/17/15 0638 04/18/15 0628 04/20/15 2114 04/21/15 0632  WBC 15.4* 12.0* 12.9* 9.9  NEUTROABS  --   --  9.8* 7.5  HGB 10.6* 10.3* 11.4* 10.3*  HCT 32.1* 30.4* 33.8* 31.2*  MCV 88.4 86.9 86.9 87.6  PLT 292 322 419* 387      Assessment/Plan Active Problems:   Dehydration   Decubitus skin ulcer   Osteomyelitis   Failure to thrive in adult  81 year old female with multiple medical problems including CHF, hyperlipidemia, paroxysmal A. fib, pressure ulcer with osteomyelitis, dysphagia who is currently being admitted under hospice care for comfort measures only. We'll continue patient on morphine when necessary, atropine drops when necessary for secretions, lorazepam when necessary.   Code status: DO NOT RESUSCITATE    Time Spent on Admission: 60 min  Candie Gintz S [facility] Pager: [phone] 04/23/2015, 5:48 PM  If 7PM-7AM, please contact night-coverage  [email or web address]  Password [email or web address]

## 2015-04-23 NOTE — Progress Notes (Signed)
Mayer CamelB. Starling, RN and I admitted [facility] under diagnosis of Malnutrition. Hospice services/philosophy explained to pt.'s daughter Eber JonesCarolyn who is the PCG. Pt is a DNR. Two weeks ago pt.'s condition began to deteriorate. Pt was [facility] to [facility] on July 4. Pt has increased weakness and decreased appetite. Daughter and pt agreed to comfort care. Pt and daughter [facility] a [facility]. HH financials explained to pt.'s daughter who verbalized agreement. Emotional support provided during visit. Plan to visit pt again tomorrow.

## 2015-04-23 NOTE — Progress Notes (Signed)
Hospice nurse and CPoteat, BSW met with the pt's daughter, Carolyn(PCG) to discuss hospice services and philosophy.  HMB was elected and consent forms were signed.  Copies of all signed forms were left with PCG.  SN and SW will visit daily until the pt is transferred to the [place] when a bed becomes available.  Medication regimen was reviewed.  Medication profile sheet was completed, reviewed and signed by the PCG.  A copy was left with PCG.  All of the pt's current medications will be covered under the HMB.  Initial assessment was completed.  Hospice nurse spoke with B.Loann Quill, RN and TBrightwell, RN, BSN to discuss pt's condition and notify them that the pt was now under the HMB.  Instructed them to notify [facility] for any concerns, questions or if the pt's condition detoriated.

## 2015-04-23 NOTE — Care Management Note (Signed)
Case Management Note  Patient Details  Name: Melanie Byrd MRN: [id number] Date of Birth: 08/20/1916  Subjective/Objective:                    Action/Plan:   Expected Discharge Date:                  Expected Discharge Plan:  Hospice Medical Facility  In-House Referral:  Clinical Social Work  Discharge planning Services  CM Consult  Post Acute Care Choice:  Hospice Choice offered to:  Adult Children  DME Arranged:    DME Agency:     HH Arranged:    HH Agency:     Status of Service:  Completed, signed off  Medicare Important Message Given:  Yes-second notification given Date Medicare IM Given:    Medicare IM give by:    Date Additional Medicare IM Given:    Additional Medicare Important Message give by:     If discussed at [place] Length of Stay Meetings, dates discussed:    Additional Comments: [facility]. No beds available today so pt being converted to GIP. No further Cm needs noted. Arlyss QueenBlackwell, Rachelle Edwards Gilbertsvillerowder, RN 04/23/2015, 2:01 PM

## 2015-04-24 DIAGNOSIS — M869 Osteomyelitis, unspecified: Secondary | ICD-10-CM

## 2015-04-24 DIAGNOSIS — R627 Adult failure to thrive: Secondary | ICD-10-CM

## 2015-04-24 DIAGNOSIS — L8990 Pressure ulcer of unspecified site, unspecified stage: Secondary | ICD-10-CM

## 2015-04-24 DIAGNOSIS — E860 Dehydration: Principal | ICD-10-CM

## 2015-04-24 LAB — WOUND CULTURE: Special Requests: NORMAL

## 2015-04-24 MED ORDER — LORAZEPAM 1 MG PO TABS: 1.0000 mg | ORAL_TABLET | ORAL | Status: AC | PRN

## 2015-04-24 MED ORDER — MORPHINE SULFATE (CONCENTRATE) 10 MG /0.5 ML PO SOLN: 10.0000 mg | ORAL | Status: AC | PRN

## 2015-04-24 MED ORDER — ATROPINE SULFATE 1 % OP SOLN: 4.0000 [drp] | OPHTHALMIC | Status: AC | PRN

## 2015-04-24 NOTE — Progress Notes (Signed)
[facility].

## 2015-04-24 NOTE — Clinical Social Work Note (Addendum)
CSW received [facility]. MD and daughter notified and agreeable. CSW will fax d/c summary upon completion. Hospice arranging EMS transport. Out of facility DNR sent with pt. Support provided.  Derenda FennelKara Heru Montz, LCSW [phone]

## 2015-04-24 NOTE — Progress Notes (Signed)
Report given to [place], [facility].

## 2015-04-24 NOTE — Progress Notes (Signed)
Nutrition Brief Note  Patient identified on the Malnutrition Screening Tool (MST) Report  Wt Readings from Last 15 Encounters:  04/22/15 106 lb 7.7 oz (48.3 kg)  04/14/15 113 lb 14.4 oz (51.665 kg)  04/15/15 113 lb (51.256 kg)  03/28/15 107 lb (48.535 kg)  03/08/15 135 lb (61.236 kg)  08/27/14 121 lb 3.2 oz (54.976 kg)   Pt with multiple recent hospital admissions, most recently d/c 04/20/2015 for failure to thrive. Pt w/ progressive decline in the cognitive and functional capacity. After goals of care meeting, decision was made to make patient comfort care only. Patient admitted under hospice care. Pt on morphine PRN.   No nutrition interventions warranted at this time. If nutrition issues arise, please consult RD.   Melanie Byrd RD, LDN Nutrition Pager: [phone] 04/24/2015 8:27 AM

## 2015-04-24 NOTE — Progress Notes (Signed)
SN visit made.  Pt lying in bed in no apparent distress.  Alert to self only.  Denies pain.  Daughter at bedside.  No bed at hospice home at this time.  Daughter notified that she will need to accompany pt when admitted to hospice home to sign admission paperwork.  Daughter verbalized understanding.  VS:  BP 110/70, 71 irr, 14, 98.7.  Lungs diminished but clear bilaterally.  Abdomen soft and non tender with hypoactive BS x 4.  Pt is NPO.  Skin:  Stage IV sacral decubitus ulcer present.  DD&I.  Foley patent draining yellow urine.  No S&S of infection noted.  Peripheral IV present in Left AC.  Pt's daughter stated that pt has gone downhill since her daughter's death x 2 months from a brain aneurysm.  Misty StanleyLisa, RN verbalized no new concerns or questions concerning pt.  Renee RivalInstructed Lisa, RN that hospice will notify when a bed becomes available.  Misty StanleyLisa, RN verbalized understanding.  Instructed daughter on 24/7 hospice availability.  Daughter verbalized understanding.  Will continue to monitor.

## 2015-04-24 NOTE — Discharge Summary (Signed)
Physician Discharge Summary  Melanie Byrd XBM:[id number] DOB: [birth year] DOA: 04/23/2015  PCP: Craig Staggers, MD  Admit date: 04/23/2015 Discharge date: 04/24/2015  Time spent: > 35 minutes  Recommendations for Outpatient Follow-up:  1. Discharge to residential hospice   Discharge Diagnoses:  Active Problems:   Dehydration   Decubitus skin ulcer   Osteomyelitis   Failure to thrive in adult  Discharge Condition: to hospice  Diet recommendation: as tolerated, comfort feeds  History of present illness:  81 yo female with htn, hyperlipidemia, CHF , Pafib (CHADS2=3), protein calorie malnutrition, apparently recently discharged recently discharged from any pain on 04/18/15 secondary to dysphagia, failure to thrive, and osteomyelitis with pressure ulcer, presents with poor appetite, and decreased oral intake  Hospital Course:  Failure to thrive - should continue to have progressive decline of her cognitive and functional capacity , and this is multifactorial daily to dementia , as well malnutrition given her dysphagia , osteomyelitis and dehydration . - Dr Huey Bienenstock discussed with daughter about overall poor prognosis, and goals of care, patient anticipated to continue have progressive decline, palliative care consult requested to assist with goals of care. He discussed with daughter and multiple family members, plan is to discharge to hospice home, goal comfort care at this point. pressure ulcer with osteomyelitis  - Unstageable , status post bedside debridement previous hospitalization . Dehydration  - To discharge for hospice home. Dysphagia  - her current dysphagia is largely secondary to cognitive impairment . They Recommend continued diet order with a focus on NTL, ice cream ok, Magic Cups as pt responded best to cold temperature. OK for ice chips if she is able to tolerate. chronic kidney disease stage III  - Stable , no further labs Hypertension  - Unable to  take oral. atrial fibrillation - Unable to take orals  Procedures:  None    Consultations:  None   Discharge Exam: Filed Vitals:   04/23/15 2200 04/24/15 0655  BP: 150/72 142/70  Pulse: 82 84  Temp: 98.6 F (37 C) 98.1 F (36.7 C)  TempSrc: Axillary Axillary  Resp: 16 16  SpO2: 100% 100%   General: NAD, lying in bed, minimally interactive  Discharge Instructions     Medication List    STOP taking these medications        amitriptyline 10 MG tablet  Commonly known as:  ELAVIL     amLODipine 5 MG tablet  Commonly known as:  NORVASC     atorvastatin 20 MG tablet  Commonly known as:  LIPITOR     cholecalciferol 1000 UNITS tablet  Commonly known as:  VITAMIN D     ciprofloxacin 500 MG tablet  Commonly known as:  CIPRO     [employer] FOLIC ACID 800 MCG tablet  Generic drug:  folic acid     ELIQUIS 2.5 MG Tabs tablet  Generic drug:  apixaban     escitalopram 10 MG tablet  Commonly known as:  LEXAPRO     gabapentin 100 MG capsule  Commonly known as:  NEURONTIN     metoprolol succinate 50 MG 24 hr tablet  Commonly known as:  TOPROL-XL     traMADol 50 MG tablet  Commonly known as:  ULTRAM     triamterene-hydrochlorothiazide 37.5-25 MG per tablet  Commonly known as:  MAXZIDE-25      TAKE these medications        acetaminophen 650 MG suppository  Commonly known as:  TYLENOL  Place 1 suppository (650 mg total)  rectally every 6 (six) hours as needed for fever or moderate pain (rectal temp >100.5).     atropine 1 % ophthalmic solution  Place 4 drops under the tongue every 4 (four) hours as needed (excessive secretions).     collagenase ointment  Commonly known as:  SANTYL  Apply topically daily.     docusate sodium 100 MG capsule  Commonly known as:  COLACE  Take 100 mg by mouth 2 (two) times daily.     feeding supplement (PRO-STAT 64) Liqd  Take 30 mLs by mouth 2 (two) times daily at 10 AM and 5 PM.     LORazepam 1 MG tablet  Commonly known as:   ATIVAN  Place 1 tablet (1 mg total) under the tongue every 4 (four) hours as needed for anxiety.     morphine CONCENTRATE 10 mg / 0.5 ml concentrated solution  Take 0.5 mLs (10 mg total) by mouth every 4 (four) hours as needed for severe pain.     RESOURCE THICKENUP CLEAR Powd  Take 120 g by mouth as needed (Nectar thick liquids).         The results of significant diagnostics from this hospitalization (including imaging, microbiology, ancillary and laboratory) are listed below for reference.    Significant Diagnostic Studies: Dg Chest 1 View  04/20/2015   CLINICAL DATA:  Patient was admitted to hospital last week for bed source with discharge on 04/18/2015. Patient is refusing to eat and has not eaten since Friday. Weakness.  EXAM: CHEST  1 VIEW  COMPARISON:  04/14/2015  FINDINGS: Normal heart size and pulmonary vascularity for technique. Slightly shallow inspiration. Linear infiltration or atelectasis demonstrated in the right lung base. This has progressed since previous study. No blunting of costophrenic angles. No pneumothorax. Calcified and tortuous aorta.  IMPRESSION: Shallow inspiration with linear infiltration or atelectasis in the right lung base.   Electronically Signed   By: Burman Nieves M.D.   On: 04/20/2015 22:20   Ct Head Wo Contrast  04/14/2015   CLINICAL DATA:  81 year old with weakness.  EXAM: CT HEAD WITHOUT CONTRAST  TECHNIQUE: Contiguous axial images were obtained from the base of the skull through the vertex without contrast.  COMPARISON:  03/08/2015  FINDINGS: Stable cerebral atrophy. Stable low-density in the periventricular white matter. No evidence for acute hemorrhage, mass lesion, midline shift, hydrocephalus or large infarct. No acute bone abnormality.  IMPRESSION: No acute intracranial abnormality.  Stable atrophy and white matter changes.   Electronically Signed   By: Richarda Overlie M.D.   On: 04/14/2015 14:20   Mr Sacrum/si Joints Wo Contrast  04/15/2015    CLINICAL DATA:  Decubitus ulcer.  Osteomyelitis of the sacrum.  EXAM: MR SACRUM WITHOUT CONTRAST  TECHNIQUE: Multiplanar, multisequence MR imaging was performed. No intravenous contrast was administered.  COMPARISON:  None.  FINDINGS: The study is degraded by motion artifact.Coccygeal osteomyelitis is present with bone marrow edema in the proximal coccygeal segments. There is a deep decubitus ulcer that extends from the RIGHT side of the gluteal cleft to the surface of the coccyx. There is loss of the normal posterior coccygeal cortex. These findings are best seen on the sagittal images. Phlegmon is present in the dorsal sacral soft tissues, representing a combination of subcutaneous edema and likely infectious myositis in the gluteus maximus muscles. There is a left-sided bladder diverticulum incidentally noted on the visible portions of the visceral pelvis.  IMPRESSION: Coccygeal osteomyelitis with deep decubitus ulcer extending to the dorsal coccygeal surface.  Surrounding cellulitis a on infectious myositis without an abscess.   Electronically Signed   By: Andreas NewportGeoffrey  Lamke M.D.   On: 04/15/2015 10:52   Dg Chest Port 1 View  04/14/2015   CLINICAL DATA:  Weakness.  Encephalopathy.  EXAM: PORTABLE CHEST - 1 VIEW  COMPARISON:  03/08/2015  FINDINGS: Patient rotated right. The Chin overlies the right apex. Normal heart size. No pleural effusion or pneumothorax. Low lung volumes with resultant pulmonary interstitial prominence. Mild bibasilar volume loss with probable atelectasis.  IMPRESSION: Bibasilar volume loss with patchy opacities, favored to represent atelectasis.   Electronically Signed   By: Jeronimo GreavesKyle  Talbot M.D.   On: 04/14/2015 14:07    Microbiology: Recent Results (from the past 240 hour(s))  Culture, blood (routine x 2)     Status: None   Collection Time: 04/14/15  1:33 PM  Result Value Ref Range Status   Specimen Description BLOOD LEFT HAND  Final   Special Requests   Final    BOTTLES DRAWN AEROBIC  AND ANAEROBIC AEB 6CC ANA 6CC   Culture NO GROWTH 5 DAYS  Final   Report Status 04/19/2015 FINAL  Final  Blood culture (routine x 2)     Status: None (Preliminary result)   Collection Time: 04/20/15  9:14 PM  Result Value Ref Range Status   Specimen Description BLOOD LEFT HAND  Final   Special Requests BOTTLES DRAWN AEROBIC AND ANAEROBIC 8CC  Final   Culture NO GROWTH 3 DAYS  Final   Report Status PENDING  Incomplete  Blood culture (routine x 2)     Status: None (Preliminary result)   Collection Time: 04/20/15  9:24 PM  Result Value Ref Range Status   Specimen Description BLOOD LEFT HAND  Final   Special Requests BOTTLES DRAWN AEROBIC AND ANAEROBIC 6CC  Final   Culture NO GROWTH 3 DAYS  Final   Report Status PENDING  Incomplete  Wound culture     Status: None   Collection Time: 04/21/15  1:30 PM  Result Value Ref Range Status   Specimen Description SACRAL  Final   Special Requests Normal  Final   Gram Stain   Final    FEW WBC PRESENT, PREDOMINANTLY MONONUCLEAR NO SQUAMOUS EPITHELIAL CELLS SEEN NO ORGANISMS SEEN Performed at [facility]    Culture   Final    MULTIPLE ORGANISMS PRESENT, NONE PREDOMINANT Note: NO STAPHYLOCOCCUS AUREUS ISOLATED NO GROUP A STREP (S.PYOGENES) ISOLATED Performed at [facility]    Report Status 04/24/2015 FINAL  Final     Labs: Basic Metabolic Panel:  Recent Labs Lab 04/18/15 0628 04/20/15 2114 04/21/15 0632 04/22/15 0641  NA 143 144 143 143  K 3.2* 3.3* 3.4* 3.7  CL 111 110 110 109  CO2 22 20* 25 24  GLUCOSE 135* 132* 120* 101*  BUN 23* 19 17 15   CREATININE 1.39* 1.16* 1.04* 1.03*  CALCIUM 9.5 9.7 9.2 9.3   Liver Function Tests:  Recent Labs Lab 04/20/15 2114 04/21/15 0632  AST 32 28  ALT 18 15  ALKPHOS 65 54  BILITOT 1.3* 0.9  PROT 6.4* 5.6*  ALBUMIN 2.4* 2.0*    Recent Labs Lab 04/20/15 2114  LIPASE 15*   CBC:  Recent Labs Lab 04/18/15 0628 04/20/15 2114 04/21/15 0632  WBC 12.0* 12.9* 9.9    NEUTROABS  --  9.8* 7.5  HGB 10.3* 11.4* 10.3*  HCT 30.4* 33.8* 31.2*  MCV 86.9 86.9 87.6  PLT 322 419* 387  SignedPamella Pert  [facility] 04/24/2015, 1:32 PM

## 2015-04-24 NOTE — Care Management Note (Signed)
Case Management Note  Patient Details  Name: Melanie Byrd MRN: [id number] Date of Birth: 05/19/1916  Subjective/Objective:                  Pt changed to GIP status.  Action/Plan: [facility]. CSW to arrange discharge to facility.  Expected Discharge Date:                  Expected Discharge Plan:  Hospice Medical Facility  In-House Referral:  Clinical Social Work  Discharge planning Services  CM Consult  Post Acute Care Choice:  Hospice Choice offered to:  Adult Children  DME Arranged:    DME Agency:     HH Arranged:    HH Agency:     Status of Service:  Completed, signed off  Medicare Important Message Given:    Date Medicare IM Given:    Medicare IM give by:    Date Additional Medicare IM Given:    Additional Medicare Important Message give by:     If discussed at [place] Length of Stay Meetings, dates discussed:    Additional Comments:  Cheryl FlashBlackwell, Freeda Spivey Crowder, RN 04/24/2015, 1:31 PM

## 2015-04-28 LAB — CULTURE, BLOOD (ROUTINE X 2)
CULTURE: NO GROWTH
CULTURE: NO GROWTH

## 2015-05-12 DEATH — deceased

## 2016-05-15 IMAGING — DX DG CHEST 1V
1 series · 1 of 1 positions shown · non-contrast
Comparison: 04/14/2015

CLINICAL DATA: Patient was admitted to hospital last week for bed
source with discharge on 04/18/2015. Patient is refusing to eat and
has not eaten since [REDACTED]. Weakness.

EXAM:
CHEST  1 VIEW

[chest ap]
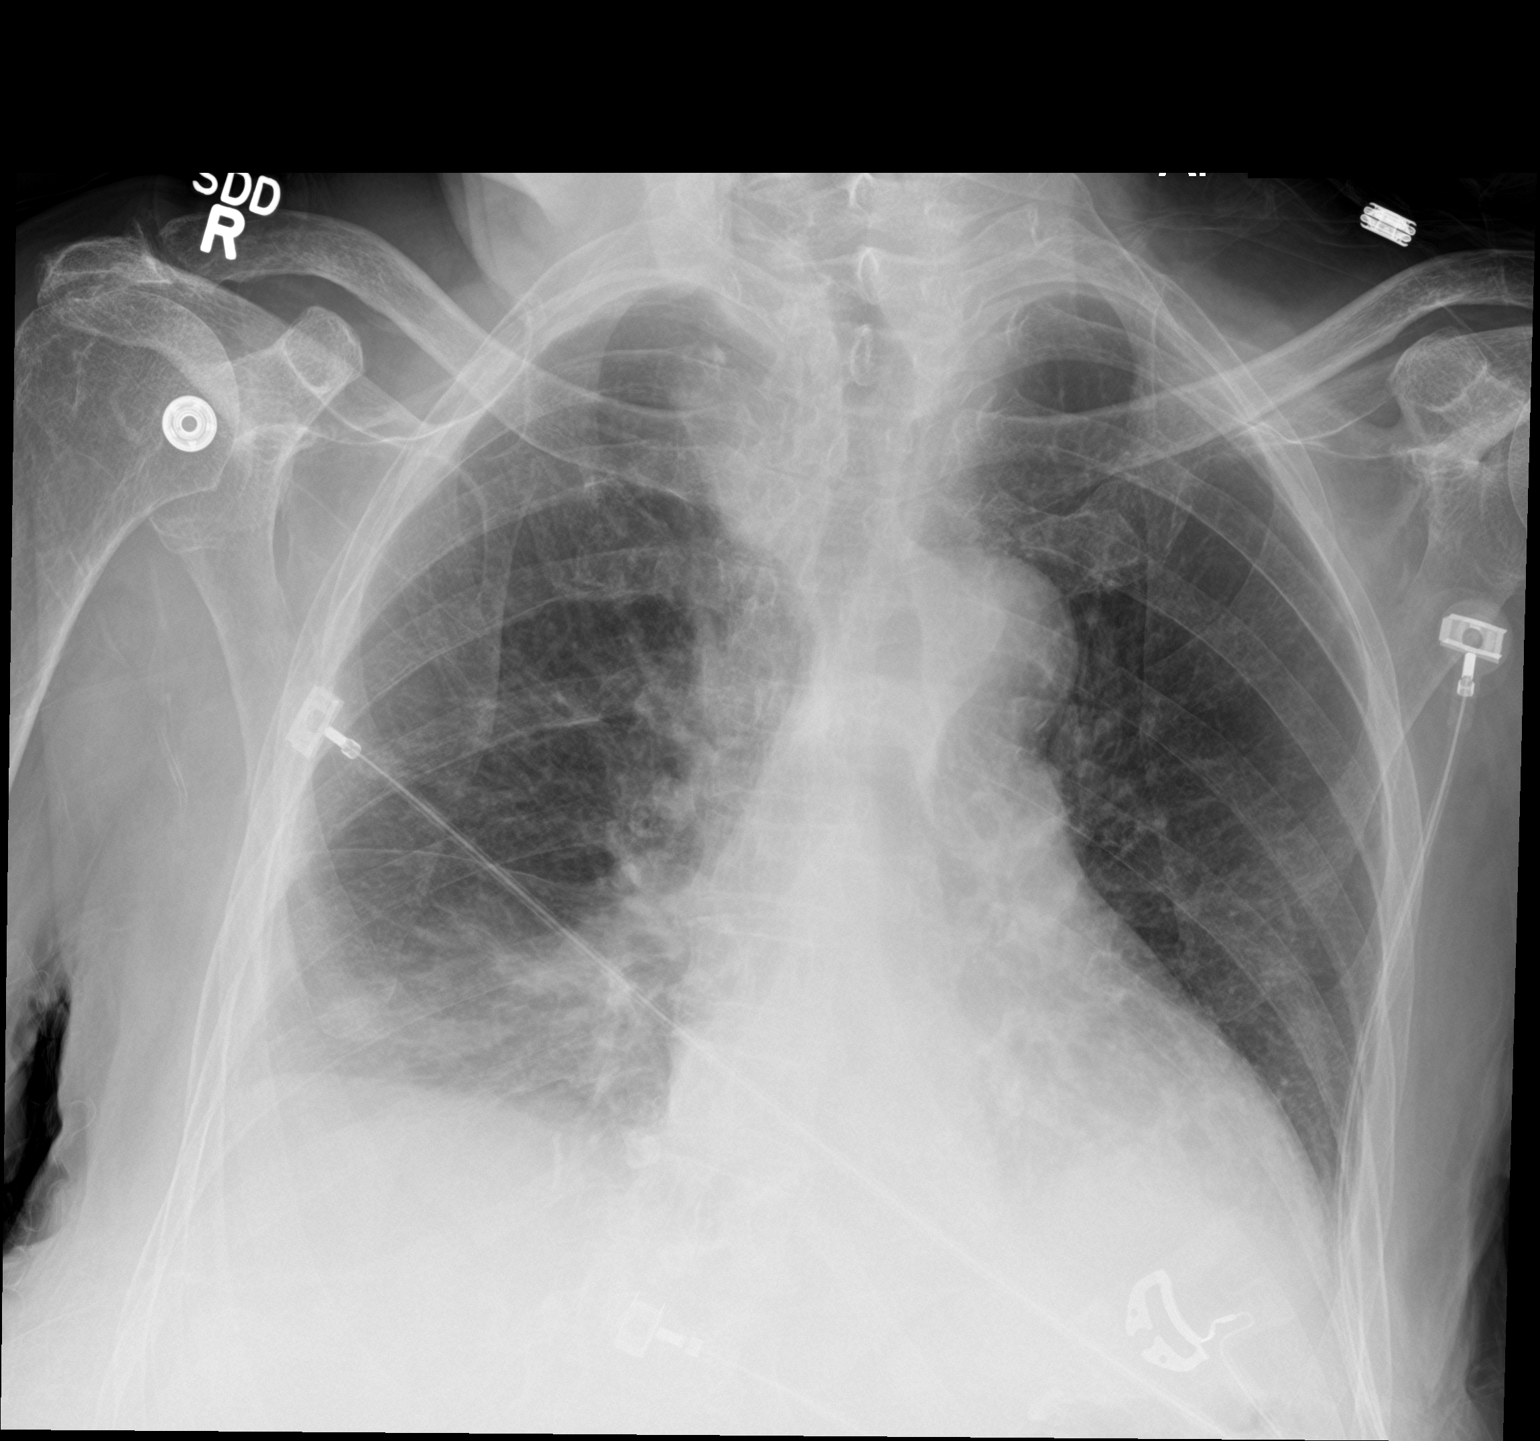

[1 of 1 positions shown; findings below may reference images not displayed]

FINDINGS: Normal heart size and pulmonary vascularity for technique. Slightly
shallow inspiration. Linear infiltration or atelectasis demonstrated
in the right lung base. This has progressed since previous study. No
blunting of costophrenic angles. No pneumothorax. Calcified and
tortuous aorta.
IMPRESSION: Shallow inspiration with linear infiltration or atelectasis in the
right lung base.
